# Patient Record
Sex: Female | Born: 1935
Health system: Southern US, Community
[De-identification: ages and names within clinical notes are randomized; demographics above are authoritative.]

## PROBLEM LIST (undated history)

## (undated) DIAGNOSIS — M1990 Unspecified osteoarthritis, unspecified site: Secondary | ICD-10-CM

## (undated) DIAGNOSIS — H5310 Unspecified subjective visual disturbances: Principal | ICD-10-CM

## (undated) DIAGNOSIS — M810 Age-related osteoporosis without current pathological fracture: Secondary | ICD-10-CM

## (undated) DIAGNOSIS — R51 Headache: Secondary | ICD-10-CM

## (undated) DIAGNOSIS — J329 Chronic sinusitis, unspecified: Secondary | ICD-10-CM

## (undated) DIAGNOSIS — L219 Seborrheic dermatitis, unspecified: Secondary | ICD-10-CM

## (undated) DIAGNOSIS — G459 Transient cerebral ischemic attack, unspecified: Secondary | ICD-10-CM

## (undated) DIAGNOSIS — H9319 Tinnitus, unspecified ear: Secondary | ICD-10-CM

## (undated) DIAGNOSIS — E785 Hyperlipidemia, unspecified: Secondary | ICD-10-CM

## (undated) DIAGNOSIS — M5030 Other cervical disc degeneration, unspecified cervical region: Secondary | ICD-10-CM

## (undated) DIAGNOSIS — K5792 Diverticulitis of intestine, part unspecified, without perforation or abscess without bleeding: Secondary | ICD-10-CM

## (undated) DIAGNOSIS — L309 Dermatitis, unspecified: Secondary | ICD-10-CM

## (undated) HISTORY — DX: Other cervical disc degeneration, unspecified cervical region: M50.30

## (undated) HISTORY — PX: NASAL POLYP SURGERY: SHX186

## (undated) HISTORY — DX: Hyperlipidemia, unspecified: E78.5

## (undated) HISTORY — PX: CATARACT EXTRACTION: SUR2

## (undated) HISTORY — DX: Tinnitus, unspecified ear: H93.19

## (undated) HISTORY — PX: TONSILLECTOMY AND ADENOIDECTOMY: SHX28

## (undated) HISTORY — PX: ABDOMINAL HYSTERECTOMY: SHX81

## (undated) HISTORY — DX: Unspecified subjective visual disturbances: H53.10

## (undated) HISTORY — DX: Headache: R51

## (undated) HISTORY — DX: Diverticulitis of intestine, part unspecified, without perforation or abscess without bleeding: K57.92

## (undated) HISTORY — DX: Age-related osteoporosis without current pathological fracture: M81.0

## (undated) HISTORY — DX: Chronic sinusitis, unspecified: J32.9

## (undated) HISTORY — DX: Seborrheic dermatitis, unspecified: L21.9

## (undated) HISTORY — DX: Dermatitis, unspecified: L30.9

## (undated) HISTORY — DX: Unspecified osteoarthritis, unspecified site: M19.90

## (undated) HISTORY — DX: Transient cerebral ischemic attack, unspecified: G45.9

---

## 1979-08-18 HISTORY — PX: APPENDECTOMY: SHX54

## 1996-08-17 HISTORY — PX: CARPAL TUNNEL RELEASE: SHX101

## 2001-06-23 ENCOUNTER — Encounter: Admission: RE | Admit: 2001-06-23 | Discharge: 2001-06-23 | Payer: Self-pay | Admitting: Internal Medicine

## 2001-06-23 ENCOUNTER — Encounter: Payer: Self-pay | Admitting: Internal Medicine

## 2004-10-13 ENCOUNTER — Ambulatory Visit (HOSPITAL_COMMUNITY): Admission: RE | Admit: 2004-10-13 | Discharge: 2004-10-13 | Payer: Self-pay | Admitting: Gastroenterology

## 2012-10-18 ENCOUNTER — Other Ambulatory Visit (HOSPITAL_BASED_OUTPATIENT_CLINIC_OR_DEPARTMENT_OTHER): Payer: Self-pay | Admitting: Family Medicine

## 2012-10-18 DIAGNOSIS — R0789 Other chest pain: Secondary | ICD-10-CM

## 2012-10-21 ENCOUNTER — Other Ambulatory Visit (HOSPITAL_BASED_OUTPATIENT_CLINIC_OR_DEPARTMENT_OTHER): Payer: Self-pay

## 2012-10-21 ENCOUNTER — Ambulatory Visit (HOSPITAL_BASED_OUTPATIENT_CLINIC_OR_DEPARTMENT_OTHER): Payer: Medicare Other

## 2012-10-24 ENCOUNTER — Encounter (HOSPITAL_BASED_OUTPATIENT_CLINIC_OR_DEPARTMENT_OTHER): Payer: Self-pay

## 2012-10-24 ENCOUNTER — Ambulatory Visit (HOSPITAL_BASED_OUTPATIENT_CLINIC_OR_DEPARTMENT_OTHER)
Admission: RE | Admit: 2012-10-24 | Discharge: 2012-10-24 | Disposition: A | Discharge status: Home / Self Care | Payer: Medicare Other | Source: Ambulatory Visit | Attending: Family Medicine | Admitting: Family Medicine

## 2012-10-24 DIAGNOSIS — R079 Chest pain, unspecified: Secondary | ICD-10-CM | POA: Insufficient documentation

## 2012-10-24 DIAGNOSIS — R0789 Other chest pain: Secondary | ICD-10-CM

## 2012-10-24 MED ORDER — IOHEXOL 300 MG/ML  SOLN
80.0000 mL | Freq: Once | INTRAMUSCULAR | Status: AC | PRN
  Administered 2012-10-24: 80 mL via INTRAVENOUS

## 2014-09-03 DIAGNOSIS — K449 Diaphragmatic hernia without obstruction or gangrene: Secondary | ICD-10-CM | POA: Diagnosis not present

## 2014-09-03 DIAGNOSIS — D132 Benign neoplasm of duodenum: Secondary | ICD-10-CM | POA: Diagnosis not present

## 2014-09-03 DIAGNOSIS — K250 Acute gastric ulcer with hemorrhage: Secondary | ICD-10-CM | POA: Diagnosis not present

## 2014-10-03 DIAGNOSIS — H539 Unspecified visual disturbance: Secondary | ICD-10-CM | POA: Diagnosis not present

## 2014-10-25 ENCOUNTER — Ambulatory Visit (INDEPENDENT_AMBULATORY_CARE_PROVIDER_SITE_OTHER): Payer: Commercial Managed Care - HMO | Admitting: Neurology

## 2014-10-25 ENCOUNTER — Encounter: Payer: Self-pay | Admitting: Neurology

## 2014-10-25 VITALS — BP 152/79 | HR 97 | Ht <= 58 in | Wt 99.6 lb

## 2014-10-25 DIAGNOSIS — G441 Vascular headache, not elsewhere classified: Secondary | ICD-10-CM | POA: Diagnosis not present

## 2014-10-25 DIAGNOSIS — R51 Headache: Secondary | ICD-10-CM

## 2014-10-25 DIAGNOSIS — H5310 Unspecified subjective visual disturbances: Secondary | ICD-10-CM | POA: Insufficient documentation

## 2014-10-25 DIAGNOSIS — R519 Headache, unspecified: Secondary | ICD-10-CM

## 2014-10-25 HISTORY — DX: Headache, unspecified: R51.9

## 2014-10-25 HISTORY — DX: Unspecified subjective visual disturbances: H53.10

## 2014-10-25 NOTE — Patient Instructions (Addendum)
   We will check MRI evaluation the brain, MRA of the head to look at the circulation to the head, and we will get a carotid Doppler study to look at the circulation in the neck. Blood work will be done today. Stay on the Plavix therapy.  Visual Disturbances You have had a disturbance in your vision. This may be caused by various conditions, such as:  Migraines. Migraine headaches are often preceded by a disturbance in vision. Blind spots or light flashes are followed by a headache. This type of visual disturbance is temporary. It does not damage the eye.  Glaucoma. This is caused by increased pressure in the eye. Symptoms include haziness, blurred vision, or seeing rainbow colored circles when looking at bright lights. Partial or complete visual loss can occur. You may or may not experience eye pain. Visual loss may be gradual or sudden and is irreversible. Glaucoma is the leading cause of blindness.  Retina problems. Vision will be reduced if the retina becomes detached or if there is a circulation problem as with diabetes, high blood pressure, or a mini-stroke. Symptoms include seeing "floaters," flashes of light, or shadows, as if a curtain has fallen over your eye.  Optic nerve problems. The main nerve in your eye can be damaged by redness, soreness, and swelling (inflammation), poor circulation, drugs, and toxins. It is very important to have a complete exam done by a specialist to determine the exact cause of your eye problem. The specialist may recommend medicines or surgery, depending on the cause of the problem. This can help prevent further loss of vision or reduce the risk of having a stroke. Contact the caregiver to whom you have been referred and arrange for follow-up care right away. SEEK IMMEDIATE MEDICAL CARE IF:   Your vision gets worse.  You develop severe headaches.  You have any weakness or numbness in the face, arms, or legs.  You have any trouble speaking or  walking. Document Released: 09/10/2004 Document Revised: 10/26/2011 Document Reviewed: 01/01/2010 [facility] Patient Information 2015 [place], [place]. This information is not intended to replace advice given to you by your health care provider. Make sure you discuss any questions you have with your health care provider.

## 2014-10-25 NOTE — Progress Notes (Signed)
Reason for visit: Transient visual disturbance  Referring physician:  Dr. Estelle June Jill Pitts is a 81 y.o. female  History of present illness:  Jill Pitts is an 81 year old right-handed white female with a history of transient visual disturbances in the past. She indicates that she first began having episodes in 2002 associated with a lower quadrant visual field deficit involving the right eye only. The patient was seen and evaluated again 7 years later in 2009 for similar events that affected the right visual field as well. The patient would have some headaches following these events of visual disturbance. The patient had a full workup in 2009 with MRI of the brain and MRA of the head and neck that were unremarkable. The patient was told she had a migraine-type events. She has been on aspirin in the past, but she could not take the medication because of an upper GI bleed associated with peptic ulcer disease. She has not been on any antiplatelet agents until just recently. On the 10th of February, 2016, the patient had an event affecting the vision in the left eye only associated with a zigzag line across the middle of the visual field and transient loss of vision below the line, normal vision above the line. The episode lasted about 30 minutes, and was followed by a right temporal headache that lasted several hours. The patient had a second event 3 days later that was similar to the first. The patient has had 2 other events of transient visual loss unassociated with headache. The patient has not been to see her ophthalmologist since the visual field episodes have occurred. The patient returns to normal vision following the events. The patient was seen by her primary care physician, and she has been placed on Plavix. She currently is on this medication. She denies any transient numbness or weakness on the face, arms, or legs. She has in the past had an episode of transient left lower face numbness in  2014 following cataract surgery. She denies any family history of migraine, and no personal history of migraine growing up. She comes here for further evaluation.  Past Medical History  Diagnosis Date  . TIA (transient ischemic attack)   . Osteoporosis   . Hyperlipidemia   . Diverticulitis   . Osteoarthritis   . Tinnitus   . Eczema   . Sinusitis   . Degenerative disc disease, cervical   . Seborrheic dermatitis   . Subjective visual disturbance 10/25/2014  . Headache 10/25/2014    Past Surgical History  Procedure Laterality Date  . Abdominal hysterectomy    . Appendectomy    . Nasal polyp surgery    . Cataract extraction Bilateral     Family History  Problem Relation Age of Onset  . Heart disease Father   . Prostate cancer Father   . Asthma Sister   . Hypertension Sister   . Diabetes Sister   . Stroke Brother     Social history:  reports that she has quit smoking. She has never used smokeless tobacco. She reports that she drinks alcohol. She reports that she does not use illicit drugs.  Medications:  Prior to Admission medications   Medication Sig Start Date End Date Taking? Authorizing Provider  augmented betamethasone dipropionate (DIPROLENE-AF) 0.05 % cream Apply topically daily.   Yes Historical Provider, MD  Biotin 1 MG CAPS Take 10,000 mg by mouth daily.   Yes Historical Provider, MD  Calcium Carb-Cholecalciferol (CALCIUM + D3) 600-200 MG-UNIT  TABS Take 1 tablet by mouth 2 (two) times daily.   Yes Historical Provider, MD  clopidogrel (PLAVIX) 75 MG tablet Take 75 mg by mouth daily.   Yes Historical Provider, MD  Fish Oil OIL Take 1,400 mg by mouth 2 (two) times daily.   Yes Historical Provider, MD  lovastatin (MEVACOR) 20 MG tablet Take 20 mg by mouth daily. 08/28/14  Yes Historical Provider, MD  Multiple Vitamins-Minerals (MULTIVITAMIN ADULT PO) Take 1 tablet by mouth daily.   Yes Historical Provider, MD     No Known Allergies  ROS:  Out of a complete 14  system review of symptoms, the patient complains only of the following symptoms, and all other reviewed systems are negative.  Loss of vision Headache Ringing in the ears  Blood pressure 152/79, pulse 97, height 4\' 10"  (1.473 m), weight 99 lb 9.6 oz (45.178 kg).  Physical Exam  General: The patient is alert and cooperative at the time of the examination.  Eyes: Pupils are equal, round, and reactive to light. Discs are flat bilaterally.  Neck: The neck is supple, no carotid bruits are noted.  Respiratory: The respiratory examination is clear.  Cardiovascular: The cardiovascular examination reveals a regular rate and rhythm, no obvious murmurs or rubs are noted.  Skin: Extremities are without significant edema.  Neurologic Exam  Mental status: The patient is alert and oriented x 3 at the time of the examination. The patient has apparent normal recent and remote memory, with an apparently normal attention span and concentration ability.  Cranial nerves: Facial symmetry is present. There is good sensation of the face to pinprick and soft touch bilaterally. The strength of the facial muscles and the muscles to head turning and shoulder shrug are normal bilaterally. Speech is well enunciated, no aphasia or dysarthria is noted. Extraocular movements are full. Visual fields are full. The tongue is midline, and the patient has symmetric elevation of the soft palate. No obvious hearing deficits are noted.  Motor: The motor testing reveals 5 over 5 strength of all 4 extremities. Good symmetric motor tone is noted throughout.  Sensory: Sensory testing is intact to pinprick, soft touch, vibration sensation, and position sense on all 4 extremities. No evidence of extinction is noted.  Coordination: Cerebellar testing reveals good finger-nose-finger and heel-to-shin bilaterally.  Gait and station: Gait is normal. Tandem gait is normal. Romberg is negative. No drift is seen.  Reflexes: Deep  tendon reflexes are symmetric, but are slightly depressed bilaterally. Toes are downgoing bilaterally.   Assessment/Plan:  1. Transient visual disturbance  2. Headache  The patient reports episodes over the last several years of occasional events of visual disturbances, occasionally followed by headache. The events certainly could represent migraine events. The patient believes that the visual episodes occur in one eye or the other, not both eyes together. The patient will once again undergo a cerebrovascular workup. She will have MRI evaluation of the brain, MRA of the head, and carotid Doppler study. She is to remain on Plavix therapy. She will have blood work done for a sedimentation rate. She will follow-up to this office if needed. I have recommended that she see her ophthalmologist for a full eye examination.  Jill Alexanders MD 10/25/2014 8:22 PM  [facility] [address] [place],  [address]  Phone [phone] Fax [phone]

## 2014-10-26 LAB — SEDIMENTATION RATE: SED RATE: 2 mm/h (ref 0–40)

## 2014-10-31 DIAGNOSIS — H35373 Puckering of macula, bilateral: Secondary | ICD-10-CM | POA: Diagnosis not present

## 2014-11-01 ENCOUNTER — Telehealth: Payer: Self-pay | Admitting: Radiology

## 2014-11-01 NOTE — Telephone Encounter (Signed)
Left message to call for scheduling Carotid doppler.

## 2014-11-14 ENCOUNTER — Ambulatory Visit (INDEPENDENT_AMBULATORY_CARE_PROVIDER_SITE_OTHER): Payer: Commercial Managed Care - HMO

## 2014-11-14 DIAGNOSIS — H5310 Unspecified subjective visual disturbances: Secondary | ICD-10-CM | POA: Diagnosis not present

## 2014-11-14 DIAGNOSIS — G441 Vascular headache, not elsewhere classified: Secondary | ICD-10-CM

## 2014-11-19 ENCOUNTER — Telehealth: Payer: Self-pay | Admitting: Neurology

## 2014-11-19 ENCOUNTER — Ambulatory Visit
Admission: RE | Admit: 2014-11-19 | Discharge: 2014-11-19 | Disposition: A | Discharge status: Home / Self Care | Payer: Commercial Managed Care - HMO | Source: Ambulatory Visit | Attending: Neurology | Admitting: Neurology

## 2014-11-19 DIAGNOSIS — G930 Cerebral cysts: Secondary | ICD-10-CM | POA: Diagnosis not present

## 2014-11-19 DIAGNOSIS — G441 Vascular headache, not elsewhere classified: Secondary | ICD-10-CM

## 2014-11-19 DIAGNOSIS — H5310 Unspecified subjective visual disturbances: Secondary | ICD-10-CM | POA: Diagnosis not present

## 2014-11-19 DIAGNOSIS — I6629 Occlusion and stenosis of unspecified posterior cerebral artery: Secondary | ICD-10-CM | POA: Diagnosis not present

## 2014-11-19 DIAGNOSIS — I6782 Cerebral ischemia: Secondary | ICD-10-CM | POA: Diagnosis not present

## 2014-11-19 NOTE — Telephone Encounter (Signed)
I called the patient. MRI the brain shows a moderate level small vessel disease, no acute changes were seen. The patient has had MRA of the head that by my review appears to be unremarkable, no significant stenosis of the medium or large artery supplying the brain. A carotid Doppler study has been done, results are pending. Patient is on Plavix therapy.   MRI brain 11/19/2014:  IMPRESSION: This is an abnormal MRI of the brain with and without contrast showing the following: 1. Extensive single and confluent hyperintense foci in both hemispheres consistent with advanced small vessel ischemic changes. None of these changes appears to be acute. 2. An extra-axial arachnoid cyst on the left between the occipital and parietal lobes. 3. Fluid within the mastoid air cells on the right most consistent with mild eustachian tube dysfunction. Mild chronic inflammatory changes in the left maxillary sinus and one of the ethmoid air cells.

## 2014-11-20 ENCOUNTER — Telehealth: Payer: Self-pay | Admitting: Neurology

## 2014-11-20 NOTE — Telephone Encounter (Signed)
I called patient. The carotid Doppler study appears to be unremarkable. I discussed this with the patient. She is to stay on Plavix, continue to monitor her blood pressure.

## 2014-12-10 DIAGNOSIS — Z8262 Family history of osteoporosis: Secondary | ICD-10-CM | POA: Diagnosis not present

## 2014-12-18 DIAGNOSIS — M1811 Unilateral primary osteoarthritis of first carpometacarpal joint, right hand: Secondary | ICD-10-CM | POA: Diagnosis not present

## 2015-03-20 DIAGNOSIS — Z1329 Encounter for screening for other suspected endocrine disorder: Secondary | ICD-10-CM | POA: Diagnosis not present

## 2015-03-20 DIAGNOSIS — E780 Pure hypercholesterolemia: Secondary | ICD-10-CM | POA: Diagnosis not present

## 2015-03-20 DIAGNOSIS — K130 Diseases of lips: Secondary | ICD-10-CM | POA: Diagnosis not present

## 2015-03-20 DIAGNOSIS — Z1211 Encounter for screening for malignant neoplasm of colon: Secondary | ICD-10-CM | POA: Diagnosis not present

## 2015-03-20 DIAGNOSIS — Z0000 Encounter for general adult medical examination without abnormal findings: Secondary | ICD-10-CM | POA: Diagnosis not present

## 2015-03-20 DIAGNOSIS — Z131 Encounter for screening for diabetes mellitus: Secondary | ICD-10-CM | POA: Diagnosis not present

## 2015-03-20 DIAGNOSIS — M810 Age-related osteoporosis without current pathological fracture: Secondary | ICD-10-CM | POA: Diagnosis not present

## 2015-03-20 DIAGNOSIS — Z23 Encounter for immunization: Secondary | ICD-10-CM | POA: Diagnosis not present

## 2015-05-08 DIAGNOSIS — H5203 Hypermetropia, bilateral: Secondary | ICD-10-CM | POA: Diagnosis not present

## 2015-05-08 DIAGNOSIS — H35373 Puckering of macula, bilateral: Secondary | ICD-10-CM | POA: Diagnosis not present

## 2015-05-08 DIAGNOSIS — Z961 Presence of intraocular lens: Secondary | ICD-10-CM | POA: Diagnosis not present

## 2015-09-13 DIAGNOSIS — R071 Chest pain on breathing: Secondary | ICD-10-CM | POA: Diagnosis not present

## 2015-09-13 DIAGNOSIS — R0781 Pleurodynia: Secondary | ICD-10-CM | POA: Diagnosis not present

## 2015-10-01 DIAGNOSIS — R918 Other nonspecific abnormal finding of lung field: Secondary | ICD-10-CM | POA: Diagnosis not present

## 2015-10-01 DIAGNOSIS — Z79899 Other long term (current) drug therapy: Secondary | ICD-10-CM | POA: Diagnosis not present

## 2015-10-01 DIAGNOSIS — Z8673 Personal history of transient ischemic attack (TIA), and cerebral infarction without residual deficits: Secondary | ICD-10-CM | POA: Diagnosis not present

## 2015-10-01 DIAGNOSIS — R0789 Other chest pain: Secondary | ICD-10-CM | POA: Diagnosis not present

## 2015-10-01 DIAGNOSIS — Z87891 Personal history of nicotine dependence: Secondary | ICD-10-CM | POA: Diagnosis not present

## 2015-10-01 DIAGNOSIS — Z7982 Long term (current) use of aspirin: Secondary | ICD-10-CM | POA: Diagnosis not present

## 2015-10-01 DIAGNOSIS — R091 Pleurisy: Secondary | ICD-10-CM | POA: Diagnosis not present

## 2015-10-01 DIAGNOSIS — I10 Essential (primary) hypertension: Secondary | ICD-10-CM | POA: Diagnosis not present

## 2015-10-01 DIAGNOSIS — J168 Pneumonia due to other specified infectious organisms: Secondary | ICD-10-CM | POA: Diagnosis not present

## 2015-10-01 DIAGNOSIS — M810 Age-related osteoporosis without current pathological fracture: Secondary | ICD-10-CM | POA: Diagnosis not present

## 2015-10-01 DIAGNOSIS — M1990 Unspecified osteoarthritis, unspecified site: Secondary | ICD-10-CM | POA: Diagnosis not present

## 2015-10-09 DIAGNOSIS — J189 Pneumonia, unspecified organism: Secondary | ICD-10-CM | POA: Diagnosis not present

## 2015-10-14 ENCOUNTER — Other Ambulatory Visit (HOSPITAL_BASED_OUTPATIENT_CLINIC_OR_DEPARTMENT_OTHER): Payer: Self-pay | Admitting: Family Medicine

## 2015-10-14 DIAGNOSIS — J189 Pneumonia, unspecified organism: Secondary | ICD-10-CM

## 2015-11-04 ENCOUNTER — Ambulatory Visit (HOSPITAL_BASED_OUTPATIENT_CLINIC_OR_DEPARTMENT_OTHER)
Admission: RE | Admit: 2015-11-04 | Discharge: 2015-11-04 | Disposition: A | Discharge status: Home / Self Care | Payer: Commercial Managed Care - HMO | Source: Ambulatory Visit | Attending: Family Medicine | Admitting: Family Medicine

## 2015-11-04 DIAGNOSIS — J189 Pneumonia, unspecified organism: Secondary | ICD-10-CM | POA: Diagnosis not present

## 2015-11-04 MED ORDER — IOHEXOL 300 MG/ML  SOLN
80.0000 mL | Freq: Once | INTRAMUSCULAR | Status: AC | PRN
  Administered 2015-11-04: 80 mL via INTRAVENOUS

## 2015-11-06 DIAGNOSIS — H35373 Puckering of macula, bilateral: Secondary | ICD-10-CM | POA: Diagnosis not present

## 2016-02-04 DIAGNOSIS — M549 Dorsalgia, unspecified: Secondary | ICD-10-CM | POA: Diagnosis not present

## 2016-03-16 DIAGNOSIS — M549 Dorsalgia, unspecified: Secondary | ICD-10-CM | POA: Diagnosis not present

## 2016-03-23 DIAGNOSIS — M549 Dorsalgia, unspecified: Secondary | ICD-10-CM | POA: Diagnosis not present

## 2016-03-30 DIAGNOSIS — R0600 Dyspnea, unspecified: Secondary | ICD-10-CM | POA: Diagnosis not present

## 2016-03-30 DIAGNOSIS — Z79899 Other long term (current) drug therapy: Secondary | ICD-10-CM | POA: Diagnosis not present

## 2016-03-30 DIAGNOSIS — Z8673 Personal history of transient ischemic attack (TIA), and cerebral infarction without residual deficits: Secondary | ICD-10-CM | POA: Diagnosis not present

## 2016-03-30 DIAGNOSIS — R0789 Other chest pain: Secondary | ICD-10-CM | POA: Diagnosis not present

## 2016-03-30 DIAGNOSIS — M546 Pain in thoracic spine: Secondary | ICD-10-CM | POA: Diagnosis not present

## 2016-03-30 DIAGNOSIS — M1990 Unspecified osteoarthritis, unspecified site: Secondary | ICD-10-CM | POA: Diagnosis not present

## 2016-03-30 DIAGNOSIS — R079 Chest pain, unspecified: Secondary | ICD-10-CM | POA: Diagnosis not present

## 2016-03-30 DIAGNOSIS — I10 Essential (primary) hypertension: Secondary | ICD-10-CM | POA: Diagnosis not present

## 2016-03-30 DIAGNOSIS — M810 Age-related osteoporosis without current pathological fracture: Secondary | ICD-10-CM | POA: Diagnosis not present

## 2016-03-30 DIAGNOSIS — Z7982 Long term (current) use of aspirin: Secondary | ICD-10-CM | POA: Diagnosis not present

## 2016-03-30 DIAGNOSIS — R110 Nausea: Secondary | ICD-10-CM | POA: Diagnosis not present

## 2016-04-10 ENCOUNTER — Encounter: Payer: Self-pay | Admitting: Neurology

## 2016-04-10 ENCOUNTER — Ambulatory Visit (INDEPENDENT_AMBULATORY_CARE_PROVIDER_SITE_OTHER): Payer: Commercial Managed Care - HMO | Admitting: Neurology

## 2016-04-10 DIAGNOSIS — M47812 Spondylosis without myelopathy or radiculopathy, cervical region: Secondary | ICD-10-CM | POA: Diagnosis not present

## 2016-04-10 MED ORDER — GABAPENTIN 100 MG PO CAPS: 100.0000 mg | ORAL_CAPSULE | Freq: Two times a day (BID) | ORAL | 2 refills | Status: DC

## 2016-04-10 NOTE — Progress Notes (Signed)
Reason for visit: Neck and arm discomfort  Referring physician: Dr. Haynes Hoehn RYN OGRADY is a 82 y.o. female  History of present illness:  Ms. Jill Pitts is an 82 year old right-handed white female with a history of onset of right interscapular pain that began in January 2017. The patient had very severe pain by February 14, and she went to the emergency room for an evaluation. She underwent an x-ray of the chest and was told she had pneumonia and that she may have pleurisy. She was treated for this with some benefit, but the pain has come and gone to some degree since that time. The patient has had increased pain around August 14, she went to the emergency room again. The patient has not had any chest pathology that explains her discomfort. The patient more recently has had some numbness into the right arm from the elbow to the wrist, she has a sensation of some slight weakness of the hand on that side. If she turns her head to the right, she will have some increased pain into the interscapular area on the right. The patient denies any discomfort on the left side or any problems with the legs. She has not had any change in her balance, no falls. She denies headaches. She will have good days and bad days with the pain. She sleeps well at night. She has Ultram to take if needed. She is sent to this office for further evaluation.  Past Medical History:  Diagnosis Date  . Degenerative disc disease, cervical   . Diverticulitis   . Eczema   . Headache 10/25/2014  . Hyperlipidemia   . Osteoarthritis   . Osteoporosis   . Seborrheic dermatitis   . Sinusitis   . Subjective visual disturbance 10/25/2014  . TIA (transient ischemic attack)   . Tinnitus     Past Surgical History:  Procedure Laterality Date  . ABDOMINAL HYSTERECTOMY    . APPENDECTOMY  1981  . CARPAL TUNNEL RELEASE Right 1998  . CATARACT EXTRACTION Bilateral   . NASAL POLYP SURGERY    . TONSILLECTOMY AND ADENOIDECTOMY      Family  History  Problem Relation Age of Onset  . Heart disease Father   . Prostate cancer Father   . Asthma Sister   . Hypertension Sister   . Diabetes Sister   . Stroke Brother     Social history:  reports that she has quit smoking. She has never used smokeless tobacco. She reports that she drinks alcohol. She reports that she does not use drugs.  Medications:  Prior to Admission medications   Medication Sig Start Date End Date Taking? Authorizing Provider  Biotin 1 MG CAPS Take 10,000 mg by mouth daily.   Yes Historical Provider, MD  Calcium Carb-Cholecalciferol (CALCIUM + D3) 600-200 MG-UNIT TABS Take 1 tablet by mouth 2 (two) times daily.   Yes Historical Provider, MD  clopidogrel (PLAVIX) 75 MG tablet Take 75 mg by mouth daily.   Yes Historical Provider, MD  Fish Oil OIL Take 1,400 mg by mouth 2 (two) times daily.   Yes Historical Provider, MD  lovastatin (MEVACOR) 20 MG tablet Take 20 mg by mouth daily. 08/28/14  Yes Historical Provider, MD  Multiple Vitamins-Minerals (MULTIVITAMIN ADULT PO) Take 1 tablet by mouth daily.   Yes Historical Provider, MD  traMADol Veatrice Bourbon) 50 MG tablet  03/16/16  Yes Historical Provider, MD     No Known Allergies  ROS:  Out of a complete 14  system review of symptoms, the patient complains only of the following symptoms, and all other reviewed systems are negative.  Numbness Achy muscles  Blood pressure (!) 190/85, pulse 88, height 4\' 10"  (1.473 m), weight 93 lb (42.2 kg).   Repeat blood pressure in right arm 148/88.  Physical Exam  General: The patient is alert and cooperative at the time of the examination.  Eyes: Pupils are equal, round, and reactive to light. Discs are flat bilaterally.  Neck: The neck is supple, no carotid bruits are noted.  Respiratory: The respiratory examination is clear.  Cardiovascular: The cardiovascular examination reveals a regular rate and rhythm, no obvious murmurs or rubs are noted.  Neuromuscular: The patient  lacks about 30 of full lateral rotation of the cervical spine bilaterally.  Skin: Extremities are without significant edema.  Neurologic Exam  Mental status: The patient is alert and oriented x 3 at the time of the examination. The patient has apparent normal recent and remote memory, with an apparently normal attention span and concentration ability.  Cranial nerves: Facial symmetry is present. There is good sensation of the face to pinprick and soft touch bilaterally. The strength of the facial muscles and the muscles to head turning and shoulder shrug are normal bilaterally. Speech is well enunciated, no aphasia or dysarthria is noted. Extraocular movements are full. Visual fields are full. The tongue is midline, and the patient has symmetric elevation of the soft palate. No obvious hearing deficits are noted.  Motor: The motor testing reveals 5 over 5 strength of all 4 extremities. Good symmetric motor tone is noted throughout.  Sensory: Sensory testing is intact to pinprick, soft touch, vibration sensation, and position sense on all 4 extremities, with exception of some decreased pinprick sensation on the right forearm is compared to the left. No evidence of extinction is noted.  Coordination: Cerebellar testing reveals good finger-nose-finger and heel-to-shin bilaterally.  Gait and station: Gait is normal. Tandem gait is normal for age. Romberg is negative. No drift is seen.  Reflexes: Deep tendon reflexes are symmetric and normal bilaterally. Toes are downgoing bilaterally.   Assessment/Plan:  1. Cervical spondylosis  The patient is reporting some discomfort into the interscapular area on the right and some numbness down the right arm. The patient may have a C6 distribution low-grade radiculopathy. The patient is not able to take nonsteroidal anti-inflammatory medications secondary to a prior history of peptic ulcer disease. She will be placed on low-dose gabapentin. If the pain  worsens, we will check MRI of the cervical spine and consider an epidural steroid injection. She will follow-up otherwise in 4 or 5 months.  Jill Alexanders MD 04/10/2016 11:57 AM  [facility] [address] [place], [place] [address]  Phone [phone] Fax [phone]

## 2016-04-10 NOTE — Patient Instructions (Addendum)
    Neurontin (gabapentin) may result in drowsiness, ankle swelling, gait instability, or possibly dizziness. Please contact our office if significant side effects occur with this medication.   Radicular Pain Radicular pain in either the arm or leg is usually from a bulging or herniated disk in the spine. A piece of the herniated disk may press against the nerves as the nerves exit the spine. This causes pain which is felt at the tips of the nerves down the arm or leg. Other causes of radicular pain may include:  Fractures.  Heart disease.  Cancer.  An abnormal and usually degenerative state of the nervous system or nerves (neuropathy). Diagnosis may require CT or MRI scanning to determine the primary cause.  Nerves that start at the neck (nerve roots) may cause radicular pain in the outer shoulder and arm. It can spread down to the thumb and fingers. The symptoms vary depending on which nerve root has been affected. In most cases radicular pain improves with conservative treatment. Neck problems may require physical therapy, a neck collar, or cervical traction. Treatment may take many weeks, and surgery may be considered if the symptoms do not improve.  Conservative treatment is also recommended for sciatica. Sciatica causes pain to radiate from the lower back or buttock area down the leg into the foot. Often there is a history of back problems. Most patients with sciatica are better after 2 to 4 weeks of rest and other supportive care. Short term bed rest can reduce the disk pressure considerably. Sitting, however, is not a good position since this increases the pressure on the disk. You should avoid bending, lifting, and all other activities which make the problem worse. Traction can be used in severe cases. Surgery is usually reserved for patients who do not improve within the first months of treatment. Only take over-the-counter or prescription medicines for pain, discomfort, or fever as  directed by your caregiver. Narcotics and muscle relaxants may help by relieving more severe pain and spasm and by providing mild sedation. Cold or massage can give significant relief. Spinal manipulation is not recommended. It can increase the degree of disc protrusion. Epidural steroid injections are often effective treatment for radicular pain. These injections deliver medicine to the spinal nerve in the space between the protective covering of the spinal cord and back bones (vertebrae). Your caregiver can give you more information about steroid injections. These injections are most effective when given within two weeks of the onset of pain.  You should see your caregiver for follow up care as recommended. A program for neck and back injury rehabilitation with stretching and strengthening exercises is an important part of management.  SEEK IMMEDIATE MEDICAL CARE IF:  You develop increased pain, weakness, or numbness in your arm or leg.  You develop difficulty with bladder or bowel control.  You develop abdominal pain.   This information is not intended to replace advice given to you by your health care provider. Make sure you discuss any questions you have with your health care provider.   Document Released: 09/10/2004 Document Revised: 08/24/2014 Document Reviewed: 02/27/2015 Elsevier Interactive Patient Education [employer].

## 2016-04-15 ENCOUNTER — Telehealth: Payer: Self-pay | Admitting: Neurology

## 2016-04-15 DIAGNOSIS — R630 Anorexia: Secondary | ICD-10-CM | POA: Diagnosis not present

## 2016-04-15 DIAGNOSIS — R002 Palpitations: Secondary | ICD-10-CM | POA: Diagnosis not present

## 2016-04-15 DIAGNOSIS — F439 Reaction to severe stress, unspecified: Secondary | ICD-10-CM | POA: Diagnosis not present

## 2016-04-15 NOTE — Telephone Encounter (Signed)
Returned call to patient - she has been taking gabapentin 100mg , BID x 4 days.  For the last day, she has been feeling palpitations and has a rapid heart rate.  She is concerned this is due to her medication.  She told me her heart last night before bed was 108 then 112 this morning.  She checked it again while I was on the phone with her and it was 106 with a BP of 149/101.

## 2016-04-15 NOTE — Telephone Encounter (Signed)
Pt called sts she ahs been taking gabapentin (NEURONTIN) 100 MG capsule for the past 4 days. She is experiencing rapid heart rate starting 2 days ago.

## 2016-04-15 NOTE — Telephone Encounter (Signed)
I called the patient. The patient indicates that after starting gabapentin she has noted increased heart rate to around 110. I have never heard of this side effect from gabapentin, it is not listed. I would recommend that she go to her primary care doctor and get EKG evaluation. Okay to stop the gabapentin for now.

## 2016-04-21 NOTE — Telephone Encounter (Signed)
I called patient. The patient apparently has had increased heart rate on gabapentin. She will stay off of the medication, if she is not having any pain, we will not start any other medications.

## 2016-04-21 NOTE — Telephone Encounter (Signed)
Returned pt TC. She reports that her HR did go up to 125 bpm during EKG but otherwise was "completely normal." This week, her HR has been running in the 70s-80s. Despite stopping gabapentin, she has not had any pain recently which she feels is related to how she has been positioning herself at night while sleeping. Since she still has remaining capsules, she would like to talk w/ Dr. Jannifer Franklin about taking gabapentin only as needed if/when pain returns before "tossing them." She plans on keeping her 4 mo follow-up appt scheduled in January.

## 2016-04-21 NOTE — Addendum Note (Signed)
Addended by: Margette Fast on: 04/21/2016 05:33 PM   Modules accepted: Orders

## 2016-04-21 NOTE — Telephone Encounter (Signed)
Patient is calling stating she had an EKG done and was told it was normal. She stopped taking gabapentin (NEURONTIN) 100 MG capsule and is feeling better. She is now pain free. She wants to know what she should do next? Please call and discuss.

## 2016-04-28 DIAGNOSIS — Z0000 Encounter for general adult medical examination without abnormal findings: Secondary | ICD-10-CM | POA: Diagnosis not present

## 2016-04-28 DIAGNOSIS — M810 Age-related osteoporosis without current pathological fracture: Secondary | ICD-10-CM | POA: Diagnosis not present

## 2016-04-28 DIAGNOSIS — Z8673 Personal history of transient ischemic attack (TIA), and cerebral infarction without residual deficits: Secondary | ICD-10-CM | POA: Diagnosis not present

## 2016-04-28 DIAGNOSIS — E7800 Pure hypercholesterolemia, unspecified: Secondary | ICD-10-CM | POA: Diagnosis not present

## 2016-04-28 DIAGNOSIS — I700 Atherosclerosis of aorta: Secondary | ICD-10-CM | POA: Diagnosis not present

## 2016-04-28 DIAGNOSIS — Z1211 Encounter for screening for malignant neoplasm of colon: Secondary | ICD-10-CM | POA: Diagnosis not present

## 2016-05-05 DIAGNOSIS — Z1211 Encounter for screening for malignant neoplasm of colon: Secondary | ICD-10-CM | POA: Diagnosis not present

## 2016-06-08 ENCOUNTER — Encounter: Payer: Self-pay | Admitting: Neurology

## 2016-06-12 DIAGNOSIS — H35373 Puckering of macula, bilateral: Secondary | ICD-10-CM | POA: Diagnosis not present

## 2016-06-12 DIAGNOSIS — H52203 Unspecified astigmatism, bilateral: Secondary | ICD-10-CM | POA: Diagnosis not present

## 2016-06-12 DIAGNOSIS — Z961 Presence of intraocular lens: Secondary | ICD-10-CM | POA: Diagnosis not present

## 2016-06-12 DIAGNOSIS — H5203 Hypermetropia, bilateral: Secondary | ICD-10-CM | POA: Diagnosis not present

## 2016-06-12 DIAGNOSIS — H524 Presbyopia: Secondary | ICD-10-CM | POA: Diagnosis not present

## 2016-06-12 DIAGNOSIS — H16223 Keratoconjunctivitis sicca, not specified as Sjogren's, bilateral: Secondary | ICD-10-CM | POA: Diagnosis not present

## 2016-07-08 DIAGNOSIS — Z23 Encounter for immunization: Secondary | ICD-10-CM | POA: Diagnosis not present

## 2016-07-08 DIAGNOSIS — T169XXA Foreign body in ear, unspecified ear, initial encounter: Secondary | ICD-10-CM | POA: Diagnosis not present

## 2016-09-14 ENCOUNTER — Encounter: Payer: Self-pay | Admitting: Neurology

## 2016-09-14 ENCOUNTER — Ambulatory Visit (INDEPENDENT_AMBULATORY_CARE_PROVIDER_SITE_OTHER): Payer: Medicare HMO | Admitting: Neurology

## 2016-09-14 VITALS — BP 166/81 | HR 87 | Ht <= 58 in | Wt 103.0 lb

## 2016-09-14 DIAGNOSIS — M47812 Spondylosis without myelopathy or radiculopathy, cervical region: Secondary | ICD-10-CM

## 2016-09-14 NOTE — Progress Notes (Signed)
Reason for visit: Right arm numbness  Jill Pitts is an 83 y.o. female  History of present illness:  Jill Pitts is a 83 year old right-handed white female with a history of right interscapular severe pain, and right arm numbness. The patient has had problems with this for almost a year. The patient was placed on low-dose gabapentin, but this resulted in increased heart rate and she could not tolerate the medication. The patient however, has done well with the interscapular pain. The discomfort has essentially gone away, but her right arm numbness has persisted from the elbow to the hand. The patient has no weakness of the extremities. She otherwise feels well. She returns to this office for an evaluation.  Past Medical History:  Diagnosis Date  . Degenerative disc disease, cervical   . Diverticulitis   . Eczema   . Headache 10/25/2014  . Hyperlipidemia   . Osteoarthritis   . Osteoporosis   . Seborrheic dermatitis   . Sinusitis   . Subjective visual disturbance 10/25/2014  . TIA (transient ischemic attack)   . Tinnitus     Past Surgical History:  Procedure Laterality Date  . ABDOMINAL HYSTERECTOMY    . APPENDECTOMY  1981  . CARPAL TUNNEL RELEASE Right 1998  . CATARACT EXTRACTION Bilateral   . NASAL POLYP SURGERY    . TONSILLECTOMY AND ADENOIDECTOMY      Family History  Problem Relation Age of Onset  . Heart disease Father   . Prostate cancer Father   . Asthma Sister   . Hypertension Sister   . Diabetes Sister   . Stroke Brother     Social history:  reports that she has quit smoking. She has never used smokeless tobacco. She reports that she drinks alcohol. She reports that she does not use drugs.    Allergies  Allergen Reactions  . Gabapentin     Tachycardia    Medications:  Prior to Admission medications   Medication Sig Start Date End Date Taking? Authorizing Provider  Biotin 1 MG CAPS Take 10,000 mg by mouth daily.   Yes Historical Provider, MD  Calcium  Carb-Cholecalciferol (CALCIUM + D3) 600-200 MG-UNIT TABS Take 1 tablet by mouth 2 (two) times daily.   Yes Historical Provider, MD  clopidogrel (PLAVIX) 75 MG tablet Take 75 mg by mouth daily.   Yes Historical Provider, MD  Co-Enzyme Q-10 30 MG CAPS Take 30 mg by mouth.   Yes Historical Provider, MD  Fish Oil OIL Take 1,400 mg by mouth 2 (two) times daily.   Yes Historical Provider, MD  lovastatin (MEVACOR) 20 MG tablet Take 20 mg by mouth daily. 08/28/14  Yes Historical Provider, MD  Multiple Vitamins-Minerals (MULTIVITAMIN ADULT PO) Take 1 tablet by mouth daily.   Yes Historical Provider, MD  traMADol Veatrice Bourbon) 50 MG tablet  03/16/16   Historical Provider, MD    ROS:  Out of a complete 14 system review of symptoms, the patient complains only of the following symptoms, and all other reviewed systems are negative.  Blurred vision Back pain Urinary urgency Right arm numbness  Blood pressure (!) 166/81, pulse 87, height 4\' 10"  (1.473 m), weight 103 lb (46.7 kg).  Physical Exam  General: The patient is alert and cooperative at the time of the examination.  Neuromuscular: The patient is able to turn the head to the left about 20, and able to turn to the right about 30.  Skin: No significant peripheral edema is noted.   Neurologic Exam  Mental status: The patient is alert and oriented x 3 at the time of the examination. The patient has apparent normal recent and remote memory, with an apparently normal attention span and concentration ability.   Cranial nerves: Facial symmetry is present. Speech is normal, no aphasia or dysarthria is noted. Extraocular movements are full. Visual fields are full.  Motor: The patient has good strength in all 4 extremities.  Sensory examination: Soft touch sensation is symmetric on the face, arms, and legs.  Coordination: The patient has good finger-nose-finger and heel-to-shin bilaterally.  Gait and station: The patient has a normal gait. Tandem  gait is slightly unsteady. Romberg is negative. No drift is seen.  Reflexes: Deep tendon reflexes are symmetric.   Assessment/Plan:  1. Right shoulder pain, right arm numbness  The patient likely has had nerve root impingement, but the pain has disappeared. The patient is not weak, but she has some residual numbness of the right arm. The patient is feeling much better at this time, we will follow her conservatively, she will come back to this office if needed.  Jill Alexanders MD 09/14/2016 4:20 PM  [facility] [address] [place], [place] [address]  Phone [phone] Fax [phone]

## 2016-09-29 DIAGNOSIS — G8929 Other chronic pain: Secondary | ICD-10-CM | POA: Diagnosis not present

## 2016-09-29 DIAGNOSIS — J069 Acute upper respiratory infection, unspecified: Secondary | ICD-10-CM | POA: Diagnosis not present

## 2016-09-29 DIAGNOSIS — R05 Cough: Secondary | ICD-10-CM | POA: Diagnosis not present

## 2016-11-05 DIAGNOSIS — R938 Abnormal findings on diagnostic imaging of other specified body structures: Secondary | ICD-10-CM | POA: Diagnosis not present

## 2016-11-05 DIAGNOSIS — R21 Rash and other nonspecific skin eruption: Secondary | ICD-10-CM | POA: Diagnosis not present

## 2016-12-02 DIAGNOSIS — M810 Age-related osteoporosis without current pathological fracture: Secondary | ICD-10-CM | POA: Diagnosis not present

## 2016-12-02 DIAGNOSIS — Z1231 Encounter for screening mammogram for malignant neoplasm of breast: Secondary | ICD-10-CM | POA: Diagnosis not present

## 2016-12-04 ENCOUNTER — Other Ambulatory Visit: Payer: Self-pay | Admitting: Pharmacist

## 2016-12-04 NOTE — Patient Outreach (Signed)
Jill Pitts is referred to pharmacist by pharmacy technician Etter Sjogren to address questions that she has about the best ways to take her medications. Called and spoke with patient. HIPAA identifiers verified and verbal consent received.  [name] asks about the timing of taking her medications and about which medications she needs to take with or without food. [name] reports that she typically takes her lovastatin with supper at home, but that about 1-2 times/month she misses a dose of this medication when she eats supper out of the house and then is not sure what to do about taking it when she gets home. Counsel [name] that it is best to take lovastatin in the evening, as she currently does, and that it is best absorbed with food. However, counsel patient that she can take this with her evening snack if she has missed taking it with supper. Counsel patient that it is best to take her calcium carbonate with meals, such as with breakfast and supper.   Counsel [name] on the benefit of using a pillbox to organize her medications. Also talk with the patient about storing her medications away from heat and humidity. Patient expresses appreciation for the answers to her questions.  Patient denies any further medication questions/concerns at this time. Provide [name] with my phone number.  Will close pharmacy episode at this time.  Harlow Asa, PharmD, [employer] [phone]

## 2016-12-15 DIAGNOSIS — Z961 Presence of intraocular lens: Secondary | ICD-10-CM | POA: Diagnosis not present

## 2016-12-15 DIAGNOSIS — H5203 Hypermetropia, bilateral: Secondary | ICD-10-CM | POA: Diagnosis not present

## 2016-12-15 DIAGNOSIS — H52203 Unspecified astigmatism, bilateral: Secondary | ICD-10-CM | POA: Diagnosis not present

## 2016-12-15 DIAGNOSIS — H31002 Unspecified chorioretinal scars, left eye: Secondary | ICD-10-CM | POA: Diagnosis not present

## 2016-12-15 DIAGNOSIS — H524 Presbyopia: Secondary | ICD-10-CM | POA: Diagnosis not present

## 2016-12-15 DIAGNOSIS — H04123 Dry eye syndrome of bilateral lacrimal glands: Secondary | ICD-10-CM | POA: Diagnosis not present

## 2016-12-15 DIAGNOSIS — H35373 Puckering of macula, bilateral: Secondary | ICD-10-CM | POA: Diagnosis not present

## 2017-05-12 DIAGNOSIS — K279 Peptic ulcer, site unspecified, unspecified as acute or chronic, without hemorrhage or perforation: Secondary | ICD-10-CM | POA: Diagnosis not present

## 2017-05-12 DIAGNOSIS — Z8673 Personal history of transient ischemic attack (TIA), and cerebral infarction without residual deficits: Secondary | ICD-10-CM | POA: Diagnosis not present

## 2017-05-12 DIAGNOSIS — M1990 Unspecified osteoarthritis, unspecified site: Secondary | ICD-10-CM | POA: Diagnosis not present

## 2017-05-12 DIAGNOSIS — M545 Low back pain: Secondary | ICD-10-CM | POA: Diagnosis not present

## 2017-05-12 DIAGNOSIS — E7800 Pure hypercholesterolemia, unspecified: Secondary | ICD-10-CM | POA: Diagnosis not present

## 2017-05-12 DIAGNOSIS — M810 Age-related osteoporosis without current pathological fracture: Secondary | ICD-10-CM | POA: Diagnosis not present

## 2017-05-12 DIAGNOSIS — Z131 Encounter for screening for diabetes mellitus: Secondary | ICD-10-CM | POA: Diagnosis not present

## 2017-05-12 DIAGNOSIS — I700 Atherosclerosis of aorta: Secondary | ICD-10-CM | POA: Diagnosis not present

## 2017-05-12 DIAGNOSIS — Z0000 Encounter for general adult medical examination without abnormal findings: Secondary | ICD-10-CM | POA: Diagnosis not present

## 2017-06-23 DIAGNOSIS — H35373 Puckering of macula, bilateral: Secondary | ICD-10-CM | POA: Diagnosis not present

## 2017-06-23 DIAGNOSIS — H04123 Dry eye syndrome of bilateral lacrimal glands: Secondary | ICD-10-CM | POA: Diagnosis not present

## 2017-12-16 DIAGNOSIS — H04123 Dry eye syndrome of bilateral lacrimal glands: Secondary | ICD-10-CM | POA: Diagnosis not present

## 2017-12-16 DIAGNOSIS — H52203 Unspecified astigmatism, bilateral: Secondary | ICD-10-CM | POA: Diagnosis not present

## 2017-12-16 DIAGNOSIS — H26492 Other secondary cataract, left eye: Secondary | ICD-10-CM | POA: Diagnosis not present

## 2017-12-16 DIAGNOSIS — H31002 Unspecified chorioretinal scars, left eye: Secondary | ICD-10-CM | POA: Diagnosis not present

## 2017-12-16 DIAGNOSIS — H524 Presbyopia: Secondary | ICD-10-CM | POA: Diagnosis not present

## 2017-12-16 DIAGNOSIS — H35373 Puckering of macula, bilateral: Secondary | ICD-10-CM | POA: Diagnosis not present

## 2017-12-29 DIAGNOSIS — Z1231 Encounter for screening mammogram for malignant neoplasm of breast: Secondary | ICD-10-CM | POA: Diagnosis not present

## 2018-01-04 DIAGNOSIS — R922 Inconclusive mammogram: Secondary | ICD-10-CM | POA: Diagnosis not present

## 2018-05-25 DIAGNOSIS — M810 Age-related osteoporosis without current pathological fracture: Secondary | ICD-10-CM | POA: Diagnosis not present

## 2018-05-25 DIAGNOSIS — Z131 Encounter for screening for diabetes mellitus: Secondary | ICD-10-CM | POA: Diagnosis not present

## 2018-05-25 DIAGNOSIS — I700 Atherosclerosis of aorta: Secondary | ICD-10-CM | POA: Diagnosis not present

## 2018-05-25 DIAGNOSIS — Z8673 Personal history of transient ischemic attack (TIA), and cerebral infarction without residual deficits: Secondary | ICD-10-CM | POA: Diagnosis not present

## 2018-05-25 DIAGNOSIS — Z79899 Other long term (current) drug therapy: Secondary | ICD-10-CM | POA: Diagnosis not present

## 2018-05-25 DIAGNOSIS — E7800 Pure hypercholesterolemia, unspecified: Secondary | ICD-10-CM | POA: Diagnosis not present

## 2018-05-25 DIAGNOSIS — M1990 Unspecified osteoarthritis, unspecified site: Secondary | ICD-10-CM | POA: Diagnosis not present

## 2018-05-25 DIAGNOSIS — M545 Low back pain: Secondary | ICD-10-CM | POA: Diagnosis not present

## 2018-05-25 DIAGNOSIS — Z0000 Encounter for general adult medical examination without abnormal findings: Secondary | ICD-10-CM | POA: Diagnosis not present

## 2018-06-29 DIAGNOSIS — H0100B Unspecified blepharitis left eye, upper and lower eyelids: Secondary | ICD-10-CM | POA: Diagnosis not present

## 2018-06-29 DIAGNOSIS — H0288B Meibomian gland dysfunction left eye, upper and lower eyelids: Secondary | ICD-10-CM | POA: Diagnosis not present

## 2018-06-29 DIAGNOSIS — H02834 Dermatochalasis of left upper eyelid: Secondary | ICD-10-CM | POA: Diagnosis not present

## 2018-06-29 DIAGNOSIS — H26492 Other secondary cataract, left eye: Secondary | ICD-10-CM | POA: Diagnosis not present

## 2018-06-29 DIAGNOSIS — H02831 Dermatochalasis of right upper eyelid: Secondary | ICD-10-CM | POA: Diagnosis not present

## 2018-06-29 DIAGNOSIS — H0288A Meibomian gland dysfunction right eye, upper and lower eyelids: Secondary | ICD-10-CM | POA: Diagnosis not present

## 2018-06-29 DIAGNOSIS — H35373 Puckering of macula, bilateral: Secondary | ICD-10-CM | POA: Diagnosis not present

## 2018-06-29 DIAGNOSIS — H04123 Dry eye syndrome of bilateral lacrimal glands: Secondary | ICD-10-CM | POA: Diagnosis not present

## 2018-06-29 DIAGNOSIS — H0100A Unspecified blepharitis right eye, upper and lower eyelids: Secondary | ICD-10-CM | POA: Diagnosis not present

## 2018-08-22 DIAGNOSIS — Z23 Encounter for immunization: Secondary | ICD-10-CM | POA: Diagnosis not present

## 2018-12-28 DIAGNOSIS — H524 Presbyopia: Secondary | ICD-10-CM | POA: Diagnosis not present

## 2018-12-28 DIAGNOSIS — H0288A Meibomian gland dysfunction right eye, upper and lower eyelids: Secondary | ICD-10-CM | POA: Diagnosis not present

## 2018-12-28 DIAGNOSIS — H35373 Puckering of macula, bilateral: Secondary | ICD-10-CM | POA: Diagnosis not present

## 2018-12-28 DIAGNOSIS — H0288B Meibomian gland dysfunction left eye, upper and lower eyelids: Secondary | ICD-10-CM | POA: Diagnosis not present

## 2018-12-28 DIAGNOSIS — H26492 Other secondary cataract, left eye: Secondary | ICD-10-CM | POA: Diagnosis not present

## 2018-12-28 DIAGNOSIS — H02834 Dermatochalasis of left upper eyelid: Secondary | ICD-10-CM | POA: Diagnosis not present

## 2018-12-28 DIAGNOSIS — H52203 Unspecified astigmatism, bilateral: Secondary | ICD-10-CM | POA: Diagnosis not present

## 2018-12-28 DIAGNOSIS — H43813 Vitreous degeneration, bilateral: Secondary | ICD-10-CM | POA: Diagnosis not present

## 2018-12-28 DIAGNOSIS — H0100A Unspecified blepharitis right eye, upper and lower eyelids: Secondary | ICD-10-CM | POA: Diagnosis not present

## 2018-12-28 DIAGNOSIS — H5203 Hypermetropia, bilateral: Secondary | ICD-10-CM | POA: Diagnosis not present

## 2018-12-28 DIAGNOSIS — H04123 Dry eye syndrome of bilateral lacrimal glands: Secondary | ICD-10-CM | POA: Diagnosis not present

## 2018-12-28 DIAGNOSIS — H02831 Dermatochalasis of right upper eyelid: Secondary | ICD-10-CM | POA: Diagnosis not present

## 2019-05-15 DIAGNOSIS — Z8262 Family history of osteoporosis: Secondary | ICD-10-CM | POA: Diagnosis not present

## 2019-05-15 DIAGNOSIS — Z1231 Encounter for screening mammogram for malignant neoplasm of breast: Secondary | ICD-10-CM | POA: Diagnosis not present

## 2019-05-15 DIAGNOSIS — M810 Age-related osteoporosis without current pathological fracture: Secondary | ICD-10-CM | POA: Diagnosis not present

## 2019-05-15 DIAGNOSIS — Z90710 Acquired absence of both cervix and uterus: Secondary | ICD-10-CM | POA: Diagnosis not present

## 2019-05-15 DIAGNOSIS — Z8269 Family history of other diseases of the musculoskeletal system and connective tissue: Secondary | ICD-10-CM | POA: Diagnosis not present

## 2019-05-30 DIAGNOSIS — Z131 Encounter for screening for diabetes mellitus: Secondary | ICD-10-CM | POA: Diagnosis not present

## 2019-05-30 DIAGNOSIS — I739 Peripheral vascular disease, unspecified: Secondary | ICD-10-CM | POA: Diagnosis not present

## 2019-05-30 DIAGNOSIS — M1990 Unspecified osteoarthritis, unspecified site: Secondary | ICD-10-CM | POA: Diagnosis not present

## 2019-05-30 DIAGNOSIS — Z0000 Encounter for general adult medical examination without abnormal findings: Secondary | ICD-10-CM | POA: Diagnosis not present

## 2019-05-30 DIAGNOSIS — K279 Peptic ulcer, site unspecified, unspecified as acute or chronic, without hemorrhage or perforation: Secondary | ICD-10-CM | POA: Diagnosis not present

## 2019-05-30 DIAGNOSIS — M810 Age-related osteoporosis without current pathological fracture: Secondary | ICD-10-CM | POA: Diagnosis not present

## 2019-05-30 DIAGNOSIS — Z23 Encounter for immunization: Secondary | ICD-10-CM | POA: Diagnosis not present

## 2019-05-30 DIAGNOSIS — I700 Atherosclerosis of aorta: Secondary | ICD-10-CM | POA: Diagnosis not present

## 2019-05-30 DIAGNOSIS — Z8673 Personal history of transient ischemic attack (TIA), and cerebral infarction without residual deficits: Secondary | ICD-10-CM | POA: Diagnosis not present

## 2019-05-30 DIAGNOSIS — E7800 Pure hypercholesterolemia, unspecified: Secondary | ICD-10-CM | POA: Diagnosis not present

## 2019-05-30 DIAGNOSIS — Z79899 Other long term (current) drug therapy: Secondary | ICD-10-CM | POA: Diagnosis not present

## 2019-05-30 DIAGNOSIS — R202 Paresthesia of skin: Secondary | ICD-10-CM | POA: Diagnosis not present

## 2019-06-19 DIAGNOSIS — H35373 Puckering of macula, bilateral: Secondary | ICD-10-CM | POA: Diagnosis not present

## 2019-08-25 DIAGNOSIS — I639 Cerebral infarction, unspecified: Secondary | ICD-10-CM

## 2019-08-25 HISTORY — DX: Cerebral infarction, unspecified: I63.9

## 2019-09-22 ENCOUNTER — Inpatient Hospital Stay (HOSPITAL_COMMUNITY)
Admission: EM | Admit: 2019-09-22 | Discharge: 2019-09-28 | DRG: 378 | Disposition: A | Discharge status: Home / Self Care | Payer: Medicare HMO | Attending: Internal Medicine | Admitting: Internal Medicine

## 2019-09-22 ENCOUNTER — Inpatient Hospital Stay (HOSPITAL_COMMUNITY): Payer: Medicare HMO

## 2019-09-22 ENCOUNTER — Encounter (HOSPITAL_COMMUNITY): Payer: Self-pay

## 2019-09-22 ENCOUNTER — Emergency Department (HOSPITAL_COMMUNITY): Payer: Medicare HMO

## 2019-09-22 ENCOUNTER — Other Ambulatory Visit: Payer: Self-pay

## 2019-09-22 DIAGNOSIS — Z87891 Personal history of nicotine dependence: Secondary | ICD-10-CM

## 2019-09-22 DIAGNOSIS — K922 Gastrointestinal hemorrhage, unspecified: Secondary | ICD-10-CM

## 2019-09-22 DIAGNOSIS — Z79899 Other long term (current) drug therapy: Secondary | ICD-10-CM

## 2019-09-22 DIAGNOSIS — K921 Melena: Secondary | ICD-10-CM | POA: Diagnosis not present

## 2019-09-22 DIAGNOSIS — K635 Polyp of colon: Secondary | ICD-10-CM | POA: Diagnosis not present

## 2019-09-22 DIAGNOSIS — K5900 Constipation, unspecified: Secondary | ICD-10-CM | POA: Diagnosis not present

## 2019-09-22 DIAGNOSIS — K5731 Diverticulosis of large intestine without perforation or abscess with bleeding: Principal | ICD-10-CM | POA: Diagnosis present

## 2019-09-22 DIAGNOSIS — Z90710 Acquired absence of both cervix and uterus: Secondary | ICD-10-CM

## 2019-09-22 DIAGNOSIS — S0990XA Unspecified injury of head, initial encounter: Secondary | ICD-10-CM | POA: Diagnosis not present

## 2019-09-22 DIAGNOSIS — Z8673 Personal history of transient ischemic attack (TIA), and cerebral infarction without residual deficits: Secondary | ICD-10-CM | POA: Diagnosis not present

## 2019-09-22 DIAGNOSIS — I959 Hypotension, unspecified: Secondary | ICD-10-CM | POA: Diagnosis present

## 2019-09-22 DIAGNOSIS — R531 Weakness: Secondary | ICD-10-CM | POA: Diagnosis not present

## 2019-09-22 DIAGNOSIS — E785 Hyperlipidemia, unspecified: Secondary | ICD-10-CM | POA: Diagnosis not present

## 2019-09-22 DIAGNOSIS — Z20822 Contact with and (suspected) exposure to covid-19: Secondary | ICD-10-CM | POA: Diagnosis present

## 2019-09-22 DIAGNOSIS — Z888 Allergy status to other drugs, medicaments and biological substances status: Secondary | ICD-10-CM | POA: Diagnosis not present

## 2019-09-22 DIAGNOSIS — R410 Disorientation, unspecified: Secondary | ICD-10-CM | POA: Diagnosis not present

## 2019-09-22 DIAGNOSIS — M810 Age-related osteoporosis without current pathological fracture: Secondary | ICD-10-CM | POA: Diagnosis present

## 2019-09-22 DIAGNOSIS — Z8042 Family history of malignant neoplasm of prostate: Secondary | ICD-10-CM | POA: Diagnosis not present

## 2019-09-22 DIAGNOSIS — R0902 Hypoxemia: Secondary | ICD-10-CM | POA: Diagnosis not present

## 2019-09-22 DIAGNOSIS — Z03818 Encounter for observation for suspected exposure to other biological agents ruled out: Secondary | ICD-10-CM | POA: Diagnosis not present

## 2019-09-22 DIAGNOSIS — Z823 Family history of stroke: Secondary | ICD-10-CM

## 2019-09-22 DIAGNOSIS — D123 Benign neoplasm of transverse colon: Secondary | ICD-10-CM | POA: Diagnosis not present

## 2019-09-22 DIAGNOSIS — Z7902 Long term (current) use of antithrombotics/antiplatelets: Secondary | ICD-10-CM | POA: Diagnosis not present

## 2019-09-22 DIAGNOSIS — Z833 Family history of diabetes mellitus: Secondary | ICD-10-CM | POA: Diagnosis not present

## 2019-09-22 DIAGNOSIS — K644 Residual hemorrhoidal skin tags: Secondary | ICD-10-CM | POA: Diagnosis not present

## 2019-09-22 DIAGNOSIS — Z8249 Family history of ischemic heart disease and other diseases of the circulatory system: Secondary | ICD-10-CM | POA: Diagnosis not present

## 2019-09-22 DIAGNOSIS — D62 Acute posthemorrhagic anemia: Secondary | ICD-10-CM | POA: Diagnosis not present

## 2019-09-22 DIAGNOSIS — R58 Hemorrhage, not elsewhere classified: Secondary | ICD-10-CM | POA: Diagnosis not present

## 2019-09-22 DIAGNOSIS — Z825 Family history of asthma and other chronic lower respiratory diseases: Secondary | ICD-10-CM

## 2019-09-22 DIAGNOSIS — D128 Benign neoplasm of rectum: Secondary | ICD-10-CM | POA: Diagnosis not present

## 2019-09-22 DIAGNOSIS — K5792 Diverticulitis of intestine, part unspecified, without perforation or abscess without bleeding: Secondary | ICD-10-CM | POA: Diagnosis not present

## 2019-09-22 DIAGNOSIS — D012 Carcinoma in situ of rectum: Secondary | ICD-10-CM | POA: Diagnosis present

## 2019-09-22 DIAGNOSIS — K5730 Diverticulosis of large intestine without perforation or abscess without bleeding: Secondary | ICD-10-CM | POA: Diagnosis not present

## 2019-09-22 DIAGNOSIS — Z86004 Personal history of in-situ neoplasm of other and unspecified digestive organs: Secondary | ICD-10-CM | POA: Diagnosis not present

## 2019-09-22 DIAGNOSIS — R29818 Other symptoms and signs involving the nervous system: Secondary | ICD-10-CM | POA: Diagnosis not present

## 2019-09-22 DIAGNOSIS — G459 Transient cerebral ischemic attack, unspecified: Secondary | ICD-10-CM | POA: Diagnosis not present

## 2019-09-22 DIAGNOSIS — R404 Transient alteration of awareness: Secondary | ICD-10-CM | POA: Diagnosis not present

## 2019-09-22 DIAGNOSIS — K621 Rectal polyp: Secondary | ICD-10-CM | POA: Diagnosis not present

## 2019-09-22 LAB — COMPREHENSIVE METABOLIC PANEL
ALT: 14 U/L (ref 0–44)
AST: 21 U/L (ref 15–41)
Albumin: 3.2 g/dL — ABNORMAL LOW (ref 3.5–5.0)
Alkaline Phosphatase: 33 U/L — ABNORMAL LOW (ref 38–126)
Anion gap: 12 (ref 5–15)
BUN: 23 mg/dL (ref 8–23)
CO2: 22 mmol/L (ref 22–32)
Calcium: 8.7 mg/dL — ABNORMAL LOW (ref 8.9–10.3)
Chloride: 108 mmol/L (ref 98–111)
Creatinine, Ser: 0.82 mg/dL (ref 0.44–1.00)
GFR calc Af Amer: >60 mL/min (ref >60)
GFR calc non Af Amer: >60 mL/min (ref >60)
Glucose, Bld: 139 mg/dL — ABNORMAL HIGH (ref 70–99)
Potassium: 4 mmol/L (ref 3.5–5.1)
Sodium: 142 mmol/L (ref 135–145)
Total Bilirubin: 0.5 mg/dL (ref 0.3–1.2)
Total Protein: 5.5 g/dL — ABNORMAL LOW (ref 6.5–8.1)

## 2019-09-22 LAB — CBC WITH DIFFERENTIAL/PLATELET
Abs Immature Granulocytes: 0.13 10*3/uL — ABNORMAL HIGH (ref 0.00–0.07)
Basophils Absolute: 0 10*3/uL (ref 0.0–0.1)
Basophils Relative: 0 %
Eosinophils Absolute: 0 10*3/uL (ref 0.0–0.5)
Eosinophils Relative: 0 %
HCT: 34 % — ABNORMAL LOW (ref 36.0–46.0)
Hemoglobin: 10.6 g/dL — ABNORMAL LOW (ref 12.0–15.0)
Immature Granulocytes: 1 %
Lymphocytes Relative: 3 %
Lymphs Abs: 0.4 10*3/uL — ABNORMAL LOW (ref 0.7–4.0)
MCH: 28.9 pg (ref 26.0–34.0)
MCHC: 31.2 g/dL (ref 30.0–36.0)
MCV: 92.6 fL (ref 80.0–100.0)
Monocytes Absolute: 0.3 10*3/uL (ref 0.1–1.0)
Monocytes Relative: 2 %
Neutro Abs: 13.9 10*3/uL — ABNORMAL HIGH (ref 1.7–7.7)
Neutrophils Relative %: 94 %
Platelets: 245 10*3/uL (ref 150–400)
RBC: 3.67 MIL/uL — ABNORMAL LOW (ref 3.87–5.11)
RDW: 12.5 % (ref 11.5–15.5)
WBC: 14.8 10*3/uL — ABNORMAL HIGH (ref 4.0–10.5)
nRBC: 0 % (ref 0.0–0.2)

## 2019-09-22 LAB — URINALYSIS, ROUTINE W REFLEX MICROSCOPIC
Bilirubin Urine: NEGATIVE
Glucose, UA: NEGATIVE mg/dL
Ketones, ur: 80 mg/dL — AB
Nitrite: NEGATIVE
Protein, ur: NEGATIVE mg/dL
Specific Gravity, Urine: >1.046 — ABNORMAL HIGH (ref 1.005–1.030)
pH: 5 (ref 5.0–8.0)

## 2019-09-22 LAB — RESPIRATORY PANEL BY RT PCR (FLU A&B, COVID)
Influenza A by PCR: NEGATIVE
Influenza B by PCR: NEGATIVE
SARS Coronavirus 2 by RT PCR: NEGATIVE

## 2019-09-22 LAB — ABO/RH: ABO/RH(D): A POS

## 2019-09-22 LAB — LIPASE, BLOOD: Lipase: 39 U/L (ref 11–51)

## 2019-09-22 LAB — PROTIME-INR
INR: 1 (ref 0.8–1.2)
Prothrombin Time: 12.9 seconds (ref 11.4–15.2)

## 2019-09-22 LAB — APTT: aPTT: 23 seconds — ABNORMAL LOW (ref 24–36)

## 2019-09-22 MED ORDER — IOHEXOL 300 MG/ML  SOLN
75.0000 mL | Freq: Once | INTRAMUSCULAR | Status: AC | PRN
  Administered 2019-09-22: 19:00:00 75 mL via INTRAVENOUS

## 2019-09-22 MED ORDER — SODIUM CHLORIDE 0.9 % IV BOLUS
500.0000 mL | Freq: Once | INTRAVENOUS | Status: AC
  Administered 2019-09-22: 500 mL via INTRAVENOUS

## 2019-09-22 MED ORDER — LORAZEPAM 2 MG/ML IJ SOLN
0.5000 mg | Freq: Once | INTRAMUSCULAR | Status: AC
  Administered 2019-09-22: 0.5 mg via INTRAVENOUS
  Filled 2019-09-22: qty 1

## 2019-09-22 MED ORDER — SODIUM CHLORIDE 0.9 % IV BOLUS
500.0000 mL | Freq: Once | INTRAVENOUS | Status: AC
  Administered 2019-09-22: 21:00:00 500 mL via INTRAVENOUS

## 2019-09-22 NOTE — ED Notes (Signed)
Please call son Jerricka Sowders at [phone] for an update

## 2019-09-22 NOTE — ED Provider Notes (Signed)
[facility] Provider Note   CSN: [id number] Arrival date & time: 09/22/19  1457     History Chief Complaint  Patient presents with  . GI Bleeding    Jill Pitts is a 86 y.o. female with history of diverticulitis, hyperlipidemia, TIA presents for evaluation of acute onset, persistent bright red blood per rectum noted earlier this morning.  Jill Pitts reports that Jill Pitts awoke at around 8 AM in her usual state of health and felt as though Jill Pitts needed to go urinate so Jill Pitts went to the bathroom.  Jill Pitts then went back to sleep and awoke at 10 AM feeling as though Jill Pitts needed to have a bowel movement.  Jill Pitts went to the bathroom and states that Jill Pitts had a large amount of bright red blood in the commode as well as watery stool.  Jill Pitts then went back to sleep and awoke again an hour or so later with recurrent feeling of needing to have a bowel movement but noted feeling "woozy" upon standing.  Jill Pitts had another episode of bright red blood with her bowel movement.  Denies urinary symptoms.  Reports intermittent sharp pains to the left lower quadrant which Jill Pitts states is a "due to my diverticulosis" which Jill Pitts experiences occasionally.  Jill Pitts cannot tell me when it started up again.  Jill Pitts denies fever, nausea, or vomiting.  No chest pain or shortness of breath.  Jill Pitts is currently anticoagulated on Plavix and has been taking her medications as prescribed.   The history is provided by the patient.       Past Medical History:  Diagnosis Date  . Degenerative disc disease, cervical   . Diverticulitis   . Eczema   . Headache 10/25/2014  . Hyperlipidemia   . Osteoarthritis   . Osteoporosis   . Seborrheic dermatitis   . Sinusitis   . Subjective visual disturbance 10/25/2014  . TIA (transient ischemic attack)   . Tinnitus     Patient Active Problem List   Diagnosis Date Noted  . Acute GI bleeding 09/22/2019  . Cervical spondylosis without myelopathy 04/10/2016  . Subjective visual  disturbance 10/25/2014  . Headache 10/25/2014    Past Surgical History:  Procedure Laterality Date  . ABDOMINAL HYSTERECTOMY    . APPENDECTOMY  1981  . CARPAL TUNNEL RELEASE Right 1998  . CATARACT EXTRACTION Bilateral   . NASAL POLYP SURGERY    . TONSILLECTOMY AND ADENOIDECTOMY       OB History   No obstetric history on file.     Family History  Problem Relation Age of Onset  . Heart disease Father   . Prostate cancer Father   . Asthma Sister   . Hypertension Sister   . Diabetes Sister   . Stroke Brother     Social History   Tobacco Use  . Smoking status: Former Research scientist (life sciences)  . Smokeless tobacco: Never Used  Substance Use Topics  . Alcohol use: Yes    Alcohol/week: 0.0 standard drinks    Comment: very rare 1-2 glasses wine per year  . Drug use: No    Home Medications Prior to Admission medications   Medication Sig Start Date End Date Taking? Authorizing Provider  Biotin 1 MG CAPS Take 10,000 mg by mouth daily.   Yes [provider]  Calcium Carb-Cholecalciferol (CALCIUM + D3) 600-200 MG-UNIT TABS Take 1 tablet by mouth 2 (two) times daily.   Yes [provider]  clopidogrel (PLAVIX) 75 MG tablet Take 75 mg by  mouth daily.   Yes [provider]  Co-Enzyme Q-10 30 MG CAPS Take 30 mg by mouth.   Yes [provider]  Fish Oil OIL Take 1,400 mg by mouth 2 (two) times daily.   Yes [provider]  lovastatin (MEVACOR) 20 MG tablet Take 20 mg by mouth daily. 08/28/14  Yes [provider]  Multiple Vitamins-Minerals (MULTIVITAMIN ADULT PO) Take 1 tablet by mouth daily.   Yes [provider]    Allergies    Gabapentin  Review of Systems   Review of Systems  Constitutional: Positive for fatigue. Negative for chills and fever.  Respiratory: Negative for shortness of breath.   Cardiovascular: Negative for chest pain.  Gastrointestinal: Positive for abdominal pain, blood in stool and diarrhea. Negative for nausea  and vomiting.  Neurological: Positive for light-headedness. Negative for syncope.    Physical Exam Updated Vital Signs BP (!) 140/56   Pulse 92   Temp 97.8 F (36.6 C) (Rectal)   Resp 17   SpO2 97%   Physical Exam Vitals and nursing note reviewed.  Constitutional:      General: Jill Pitts is not in acute distress.    Appearance: Jill Pitts is well-developed.  HENT:     Head: Normocephalic and atraumatic.  Eyes:     General:        Right eye: No discharge.        Left eye: No discharge.     Conjunctiva/sclera: Conjunctivae normal.  Neck:     Vascular: No JVD.     Trachea: No tracheal deviation.  Cardiovascular:     Rate and Rhythm: Normal rate and regular rhythm.  Pulmonary:     Effort: Pulmonary effort is normal.     Breath sounds: Normal breath sounds.  Abdominal:     General: Abdomen is flat. Bowel sounds are normal. There is no distension.     Palpations: Abdomen is soft.     Tenderness: There is abdominal tenderness in the epigastric area, periumbilical area, suprapubic area and left upper quadrant. There is no right CVA tenderness, left CVA tenderness, guarding or rebound.  Genitourinary:    Comments: Examination performed in the presence of a chaperone.  Patient with frank rectal bleeding.  Few external hemorrhoids which are not thrombosed or tender. Musculoskeletal:     Cervical back: Normal range of motion and neck supple.  Skin:    General: Skin is warm and dry.     Findings: No erythema.  Neurological:     Mental Status: Jill Pitts is alert.     Comments: Fluent speech with no evidence of dysarthria or aphasia  Psychiatric:        Behavior: Behavior normal.     ED Results / Procedures / Treatments   Labs (all labs ordered are listed, but only abnormal results are displayed) Labs Reviewed  COMPREHENSIVE METABOLIC PANEL - Abnormal; Notable for the following components:      Result Value   Glucose, Bld 139 (*)    Calcium 8.7 (*)    Total Protein 5.5 (*)    Albumin 3.2  (*)    Alkaline Phosphatase 33 (*)    All other components within normal limits  CBC WITH DIFFERENTIAL/PLATELET - Abnormal; Notable for the following components:   WBC 14.8 (*)    RBC 3.67 (*)    Hemoglobin 10.6 (*)    HCT 34.0 (*)    Neutro Abs 13.9 (*)    Lymphs Abs 0.4 (*)    Abs  Immature Granulocytes 0.13 (*)    All other components within normal limits  URINALYSIS, ROUTINE W REFLEX MICROSCOPIC - Abnormal; Notable for the following components:   Specific Gravity, Urine >1.046 (*)    Hgb urine dipstick MODERATE (*)    Ketones, ur 80 (*)    Leukocytes,Ua TRACE (*)    Bacteria, UA RARE (*)    All other components within normal limits  APTT - Abnormal; Notable for the following components:   aPTT 23 (*)    All other components within normal limits  RESPIRATORY PANEL BY RT PCR (FLU A&B, COVID)  LIPASE, BLOOD  PROTIME-INR  TYPE AND SCREEN  ABO/RH    EKG None  Radiology CT Head Wo Contrast  Result Date: 09/22/2019 CLINICAL DATA:  86 year old female with fall.  Concern for TIA. EXAM: CT HEAD WITHOUT CONTRAST TECHNIQUE: Contiguous axial images were obtained from the base of the skull through the vertex without intravenous contrast. COMPARISON:  None FINDINGS: Brain: Moderate age-related atrophy and chronic microvascular ischemic changes. There is no acute intracranial hemorrhage. No mass effect or midline shift no extra-axial fluid collection. Vascular: No hyperdense vessel or unexpected calcification. Skull: Normal. Negative for fracture or focal lesion. Sinuses/Orbits: There is complete opacification of the right frontal sinus and multiple ethmoid air cells. No air-fluid level. Small bilateral maxillary sinus retention cysts or polyps noted. The mastoid air cells are clear. Other: None IMPRESSION: 1. No acute intracranial hemorrhage. 2. Age-related atrophy and chronic microvascular ischemic changes. Electronically Signed   By: Anner Crete M.D.   On: 09/22/2019 19:36   MR BRAIN  WO CONTRAST  Result Date: 09/22/2019 CLINICAL DATA:  GI bleeding. Anticoagulated. Focal neurological deficit. Fell. EXAM: MRI HEAD WITHOUT CONTRAST TECHNIQUE: Multiplanar, multiecho pulse sequences of the brain and surrounding structures were obtained without intravenous contrast. COMPARISON:  Head CT same day.  MRI 11/19/2014. FINDINGS: Brain: Diffusion imaging does not show any acute or subacute infarction. No focal finding affects the brainstem or cerebellum. Cerebral hemispheres show pronounced confluent changes of small vessel disease throughout the deep and subcortical white matter. There is age related generalized atrophy. No sign of large vessel infarction. No mass lesion, hemorrhage, hydrocephalus or extra-axial collection. Vascular: Major vessels at the base of the brain show flow. Skull and upper cervical spine: Negative Sinuses/Orbits: Opacification of the right frontal and anterior ethmoid sinuses. Other paranasal sinuses are clear. Orbits are negative. Previous lens implants. Other: Question nasal polyp on the left versus inflamed or engorged turbinates. IMPRESSION: No acute intracranial finding. Age related atrophy. Advanced chronic small-vessel ischemic changes affecting the cerebral hemispheric white matter. Opacified right frontal and anterior ethmoid sinuses. Question left nasal polyp versus engorged turbinate. Electronically Signed   By: Nelson Chimes M.D.   On: 09/22/2019 22:48   CT ABDOMEN PELVIS W CONTRAST  Result Date: 09/22/2019 CLINICAL DATA:  Diverticulitis. EXAM: CT ABDOMEN AND PELVIS WITH CONTRAST TECHNIQUE: Multidetector CT imaging of the abdomen and pelvis was performed using the standard protocol following bolus administration of intravenous contrast. CONTRAST:  99mL OMNIPAQUE IOHEXOL 300 MG/ML  SOLN COMPARISON:  None. FINDINGS: Lower chest: The lung bases are clear. The heart size is normal. Hepatobiliary: The liver is normal. Normal gallbladder.There is no biliary ductal  dilation. Pancreas: Normal contours without ductal dilatation. No peripancreatic fluid collection. Spleen: No splenic laceration or hematoma. Adrenals/Urinary Tract: --Adrenal glands: No adrenal hemorrhage. --Right kidney/ureter: No hydronephrosis or perinephric hematoma. --Left kidney/ureter: No hydronephrosis or perinephric hematoma. --Urinary bladder: Unremarkable. Stomach/Bowel: --Stomach/Duodenum: No hiatal hernia  or other gastric abnormality. Normal duodenal course and caliber. --Small bowel: No dilatation or inflammation. --Colon: Rectosigmoid diverticulosis without acute inflammation. --Appendix: Normal. Vascular/Lymphatic: Atherosclerotic calcification is present within the non-aneurysmal abdominal aorta, without hemodynamically significant stenosis. --No retroperitoneal lymphadenopathy. --No mesenteric lymphadenopathy. --No pelvic or inguinal lymphadenopathy. Reproductive: Status post hysterectomy. No adnexal mass. Other: No ascites or free air. There is a small fat containing umbilical hernia. Musculoskeletal. There is a prominent Schmorl's node involving the superior endplate of the L4 vertebral body. Multilevel degenerative changes are noted throughout the visualized thoracolumbar spine without evidence for definite acute displaced fracture. IMPRESSION: Rectosigmoid diverticulosis without acute inflammation. Aortic Atherosclerosis (ICD10-I70.0). Electronically Signed   By: Constance Holster M.D.   On: 09/22/2019 19:15    Procedures Procedures (including critical care time)  Medications Ordered in ED Medications  sodium chloride 0.9 % bolus 500 mL (0 mLs Intravenous Stopped 09/22/19 1855)  sodium chloride 0.9 % bolus 500 mL (0 mLs Intravenous Stopped 09/22/19 2041)  iohexol (OMNIPAQUE) 300 MG/ML solution 75 mL (75 mLs Intravenous Contrast Given 09/22/19 1841)  sodium chloride 0.9 % bolus 500 mL (500 mLs Intravenous New Bag/Given 09/22/19 2049)  LORazepam (ATIVAN) injection 0.5 mg (0.5 mg Intravenous  Given 09/22/19 2206)    ED Course  I have reviewed the triage vital signs and the nursing notes.  Pertinent labs & imaging results that were available during my care of the patient were reviewed by me and considered in my medical decision making (see chart for details).    MDM Rules/Calculators/A&P                      Patient presenting for evaluation of bright red blood per rectum beginning today.  Jill Pitts is afebrile, blood pressures are soft but improved after IV fluid boluses.  Jill Pitts is nontoxic in appearance.  Jill Pitts has left-sided abdominal pains on examination but states this is not necessarily unusual for her given her history of diverticulitis.  On exam Jill Pitts has frank rectal bleeding.  Spoke with patient's son to obtain collateral information. States the patent lives with him. Earlier this morning he was in the living room when he heard the patient calling out for him.  When he found the patient downstairs he noted blood from her bathroom to the bedroom.  He noted that Jill Pitts was confused and had difficulty forming words.  The symptoms lasted around 15 minutes from when the patient's son found her until EMS arrived and he noted that her speech had improved and her cognitive responses also improved.  He is concerned Jill Pitts may have had a TIA.  On my assessment speech is fluent and goal oriented.  Moves all extremities spontaneously and cranial nerves appear grossly intact.  Jill Pitts has no focal neurologic deficits.  We will obtain a head CT but I have a low suspicion of CVA at this time.  Jill Pitts could have possibly had a TIA though I suspect what is more likely is that Jill Pitts was temporarily altered due to hypotension after standing.  Lab work reviewed by me shows leukocytosis of 14.8, anemia of 10.6 but I do not know what her baseline is.  No renal insufficiency, no metabolic derangements.  UA suggests dehydration with elevated specific gravity and ketones.  Less suggestive of UTI.  Has some blood in her urine though  I suspect this is likely contaminant from her rectal bleeding.  Imaging shows no acute intracranial abnormalities.  Jill Pitts has diverticulosis but no evidence of acute diverticulitis.  No other acute surgical abdominal pathology noted on imaging today.  7:57 PM CONSULT: Spoke with Dr. Michail Sermon with GI, plan for likely scope in the morning.  GI will assess in the a.m. he suspects the patient has a diverticular bleed.  Spoke with Dr. Hal Hope with [facility] hospitalist service who agrees to assume care of patient and bring her into the hospital for further evaluation and management.  Final Clinical Impression(s) / ED Diagnoses Final diagnoses:  Acute GI bleeding    Rx / DC Orders ED Discharge Orders    None       Debroah Baller 09/22/19 2307    Sherwood Gambler, MD 09/26/19 [id number]

## 2019-09-22 NOTE — ED Triage Notes (Signed)
Pt bib [facility] from w/ c/o gi bleeding. Pt had 2 episodes of bright red bloody stools. Pt takes plavix for hx of TIAs. Pt AOx4.  EMS VSS: BP 130/80 RR 16 SPO2 1005 on RA HR 110

## 2019-09-23 ENCOUNTER — Encounter (HOSPITAL_COMMUNITY): Payer: Self-pay | Admitting: Internal Medicine

## 2019-09-23 DIAGNOSIS — R410 Disorientation, unspecified: Secondary | ICD-10-CM

## 2019-09-23 DIAGNOSIS — K922 Gastrointestinal hemorrhage, unspecified: Secondary | ICD-10-CM

## 2019-09-23 DIAGNOSIS — D62 Acute posthemorrhagic anemia: Secondary | ICD-10-CM

## 2019-09-23 LAB — CBC
HCT: 24.8 % — ABNORMAL LOW (ref 36.0–46.0)
HCT: 23.7 % — ABNORMAL LOW (ref 36.0–46.0)
HCT: 23.1 % — ABNORMAL LOW (ref 36.0–46.0)
HCT: 32 % — ABNORMAL LOW (ref 36.0–46.0)
HCT: 29.3 % — ABNORMAL LOW (ref 36.0–46.0)
Hemoglobin: 8.1 g/dL — ABNORMAL LOW (ref 12.0–15.0)
Hemoglobin: 7.6 g/dL — ABNORMAL LOW (ref 12.0–15.0)
Hemoglobin: 7.4 g/dL — ABNORMAL LOW (ref 12.0–15.0)
Hemoglobin: 10.4 g/dL — ABNORMAL LOW (ref 12.0–15.0)
Hemoglobin: 9.7 g/dL — ABNORMAL LOW (ref 12.0–15.0)
MCH: 29.1 pg (ref 26.0–34.0)
MCH: 28.9 pg (ref 26.0–34.0)
MCH: 29 pg (ref 26.0–34.0)
MCH: 29.5 pg (ref 26.0–34.0)
MCH: 29.7 pg (ref 26.0–34.0)
MCHC: 32.7 g/dL (ref 30.0–36.0)
MCHC: 32.1 g/dL (ref 30.0–36.0)
MCHC: 32 g/dL (ref 30.0–36.0)
MCHC: 32.5 g/dL (ref 30.0–36.0)
MCHC: 33.1 g/dL (ref 30.0–36.0)
MCV: 89.2 fL (ref 80.0–100.0)
MCV: 90.1 fL (ref 80.0–100.0)
MCV: 90.6 fL (ref 80.0–100.0)
MCV: 90.9 fL (ref 80.0–100.0)
MCV: 89.6 fL (ref 80.0–100.0)
Platelets: 179 10*3/uL (ref 150–400)
Platelets: 184 10*3/uL (ref 150–400)
Platelets: 167 10*3/uL (ref 150–400)
Platelets: 179 10*3/uL (ref 150–400)
Platelets: 180 10*3/uL (ref 150–400)
RBC: 2.78 MIL/uL — ABNORMAL LOW (ref 3.87–5.11)
RBC: 2.63 MIL/uL — ABNORMAL LOW (ref 3.87–5.11)
RBC: 2.55 MIL/uL — ABNORMAL LOW (ref 3.87–5.11)
RBC: 3.52 MIL/uL — ABNORMAL LOW (ref 3.87–5.11)
RBC: 3.27 MIL/uL — ABNORMAL LOW (ref 3.87–5.11)
RDW: 12.5 % (ref 11.5–15.5)
RDW: 12.8 % (ref 11.5–15.5)
RDW: 12.7 % (ref 11.5–15.5)
RDW: 12.8 % (ref 11.5–15.5)
RDW: 12.7 % (ref 11.5–15.5)
WBC: 8.7 10*3/uL (ref 4.0–10.5)
WBC: 7.7 10*3/uL (ref 4.0–10.5)
WBC: 6.1 10*3/uL (ref 4.0–10.5)
WBC: 6.9 10*3/uL (ref 4.0–10.5)
WBC: 7.3 10*3/uL (ref 4.0–10.5)
nRBC: 0 % (ref 0.0–0.2)
nRBC: 0 % (ref 0.0–0.2)
nRBC: 0 % (ref 0.0–0.2)
nRBC: 0 % (ref 0.0–0.2)
nRBC: 0 % (ref 0.0–0.2)

## 2019-09-23 LAB — BASIC METABOLIC PANEL
Anion gap: 6 (ref 5–15)
BUN: 14 mg/dL (ref 8–23)
CO2: 25 mmol/L (ref 22–32)
Calcium: 8 mg/dL — ABNORMAL LOW (ref 8.9–10.3)
Chloride: 112 mmol/L — ABNORMAL HIGH (ref 98–111)
Creatinine, Ser: 0.62 mg/dL (ref 0.44–1.00)
GFR calc Af Amer: >60 mL/min (ref >60)
GFR calc non Af Amer: >60 mL/min (ref >60)
Glucose, Bld: 105 mg/dL — ABNORMAL HIGH (ref 70–99)
Potassium: 3.7 mmol/L (ref 3.5–5.1)
Sodium: 143 mmol/L (ref 135–145)

## 2019-09-23 LAB — PREPARE RBC (CROSSMATCH)

## 2019-09-23 MED ORDER — ACETAMINOPHEN 650 MG RE SUPP: 650.0000 mg | Freq: Four times a day (QID) | RECTAL | Status: DC | PRN

## 2019-09-23 MED ORDER — SODIUM CHLORIDE 0.9 % IV SOLN: INTRAVENOUS | Status: AC

## 2019-09-23 MED ORDER — ONDANSETRON HCL 4 MG PO TABS: 4.0000 mg | ORAL_TABLET | Freq: Four times a day (QID) | ORAL | Status: DC | PRN

## 2019-09-23 MED ORDER — ACETAMINOPHEN 325 MG PO TABS: 650.0000 mg | ORAL_TABLET | Freq: Four times a day (QID) | ORAL | Status: DC | PRN

## 2019-09-23 MED ORDER — SODIUM CHLORIDE 0.9% IV SOLUTION: Freq: Once | INTRAVENOUS | Status: AC

## 2019-09-23 MED ORDER — ONDANSETRON HCL 4 MG/2ML IJ SOLN
4.0000 mg | Freq: Four times a day (QID) | INTRAMUSCULAR | Status: DC | PRN
  Administered 2019-09-25: 08:00:00 4 mg via INTRAVENOUS
  Filled 2019-09-23: qty 2

## 2019-09-23 NOTE — Plan of Care (Signed)
Poc progressing.  

## 2019-09-23 NOTE — Progress Notes (Signed)
Patient seen and examined.  87 year old female, functional and lives at home with history of TIA on Plavix and statins, history of diverticulosis presented to the emergency room with 2 large bloody bowel movements and dizziness and confusion. In the emergency room, hemodynamically stable.  Hemoglobin 10.6.  CT abdomen unremarkable.  CT head and MRI brain normal.  #1. acute blood loss anemia due to lower GI bleeding: Hemoglobin 10.6-8.6-7.6-7.4.  Currently her blood pressures are stable. With significant drop in hemoglobin more than 3 points and ongoing blood loss, will transfuse 1 unit of PRBC today to keep hemoglobin more than 8. Patient consented for PRBC transfusion. Maintenance IV fluids. Plavix on hold. N.p.o. H&H every 6 hours,  GI consulted.  Await their recommendation whether she will need colonoscopy today. I called and updated patient's son over the phone about plan of care, transfusions and possible colonoscopy by gastroenterology.

## 2019-09-23 NOTE — H&P (Signed)
History and Physical    Jill Pitts [id number] DOB: 1928-10-07 DOA: 09/22/2019  PCP: Orpah Melter, MD  Patient coming from: Home.  Chief Complaint: Rectal bleeding.  HPI: Jill Pitts is a 86 y.o. female with history of TIA on Plavix and statins was brought to the ER after patient had rectal bleeding.  Patient states that her symptoms started around 11 AM yesterday.  Had a large bowel movement which was bloody and had another one around 1 hour later.  Following the second episode patient felt very weak and almost passed out.  Patient's son noted that patient was confused and had some difficulty bringing out words at this point patient's son called EMS and the patient was brought to the ER.  ED Course: In the ER patient's vitals are stable hemoglobin is around 10.6 no old labs to compare.  Patient complained of some abdominal discomfort CT abdomen was unremarkable except for diverticulosis.  CT head and MRI brain was unremarkable.  ER physician discussed with Dr. Michail Sermon on-call gastroenterologist who advised admission and will be seeing patient in consult.  Covid test was negative.  Review of Systems: As per HPI, rest all negative.   Past Medical History:  Diagnosis Date  . Degenerative disc disease, cervical   . Diverticulitis   . Eczema   . Headache 10/25/2014  . Hyperlipidemia   . Osteoarthritis   . Osteoporosis   . Seborrheic dermatitis   . Sinusitis   . Subjective visual disturbance 10/25/2014  . TIA (transient ischemic attack)   . Tinnitus     Past Surgical History:  Procedure Laterality Date  . ABDOMINAL HYSTERECTOMY    . APPENDECTOMY  1981  . CARPAL TUNNEL RELEASE Right 1998  . CATARACT EXTRACTION Bilateral   . NASAL POLYP SURGERY    . TONSILLECTOMY AND ADENOIDECTOMY       reports that she has quit smoking. She has never used smokeless tobacco. She reports current alcohol use. She reports that she does not use drugs.  Allergies  Allergen Reactions  .  Gabapentin     Tachycardia    Family History  Problem Relation Age of Onset  . Heart disease Father   . Prostate cancer Father   . Asthma Sister   . Hypertension Sister   . Diabetes Sister   . Stroke Brother     Prior to Admission medications   Medication Sig Start Date End Date Taking? Authorizing Provider  Biotin 1 MG CAPS Take 10,000 mg by mouth daily.   Yes [provider]  Calcium Carb-Cholecalciferol (CALCIUM + D3) 600-200 MG-UNIT TABS Take 1 tablet by mouth 2 (two) times daily.   Yes [provider]  clopidogrel (PLAVIX) 75 MG tablet Take 75 mg by mouth daily.   Yes [provider]  Co-Enzyme Q-10 30 MG CAPS Take 30 mg by mouth.   Yes [provider]  Fish Oil OIL Take 1,400 mg by mouth 2 (two) times daily.   Yes [provider]  lovastatin (MEVACOR) 20 MG tablet Take 20 mg by mouth daily. 08/28/14  Yes [provider]  Multiple Vitamins-Minerals (MULTIVITAMIN ADULT PO) Take 1 tablet by mouth daily.   Yes [provider]    Physical Exam: Constitutional: Moderately built and nourished. Vitals:   09/22/19 1800 09/22/19 1900 09/22/19 2100 09/22/19 2345  BP: (!) 108/54 (!) 140/56 (!) 125/54 125/60  Pulse: (!) 101 92 86   Resp: 16 17 (!) 24 18  Temp:  98.9 F (37.2 C)  TempSrc:    Oral  SpO2: 98% 97% 98% 98%  Weight:    43 kg   Eyes: Anicteric no pallor. ENMT: No discharge from the ears eyes nose or mouth. Neck: No mass or.  No neck rigidity. Respiratory: No rhonchi or crepitations. Cardiovascular: S1-S2 heard. Abdomen: Soft nontender bowel sounds present. Musculoskeletal: No edema.  No joint effusion. Skin: No rash. Neurologic: Alert awake oriented time place and person.  Moves all extremities. Psychiatric: Appears normal with normal affect.   Labs on Admission: I have personally reviewed following labs and imaging studies  CBC: Recent Labs  Lab 09/22/19 1532  WBC 14.8*  NEUTROABS 13.9*  HGB  10.6*  HCT 34.0*  MCV 92.6  PLT 99991111   Basic Metabolic Panel: Recent Labs  Lab 09/22/19 1532  NA 142  K 4.0  CL 108  CO2 22  GLUCOSE 139*  BUN 23  CREATININE 0.82  CALCIUM 8.7*   GFR: CrCl cannot be calculated (Unknown ideal weight.). Liver Function Tests: Recent Labs  Lab 09/22/19 1532  AST 21  ALT 14  ALKPHOS 33*  BILITOT 0.5  PROT 5.5*  ALBUMIN 3.2*   Recent Labs  Lab 09/22/19 1532  LIPASE 39   No results for input(s): AMMONIA in the last 168 hours. Coagulation Profile: Recent Labs  Lab 09/22/19 1543  INR 1.0   Cardiac Enzymes: No results for input(s): CKTOTAL, CKMB, CKMBINDEX, TROPONINI in the last 168 hours. BNP (last 3 results) No results for input(s): PROBNP in the last 8760 hours. HbA1C: No results for input(s): HGBA1C in the last 72 hours. CBG: No results for input(s): GLUCAP in the last 168 hours. Lipid Profile: No results for input(s): CHOL, HDL, LDLCALC, TRIG, CHOLHDL, LDLDIRECT in the last 72 hours. Thyroid Function Tests: No results for input(s): TSH, T4TOTAL, FREET4, T3FREE, THYROIDAB in the last 72 hours. Anemia Panel: No results for input(s): VITAMINB12, FOLATE, FERRITIN, TIBC, IRON, RETICCTPCT in the last 72 hours. Urine analysis:    Component Value Date/Time   COLORURINE YELLOW 09/22/2019 2035   APPEARANCEUR CLEAR 09/22/2019 2035   LABSPEC >1.046 (H) 09/22/2019 2035   PHURINE 5.0 09/22/2019 2035   GLUCOSEU NEGATIVE 09/22/2019 2035   HGBUR MODERATE (A) 09/22/2019 2035   BILIRUBINUR NEGATIVE 09/22/2019 2035   KETONESUR 80 (A) 09/22/2019 2035   PROTEINUR NEGATIVE 09/22/2019 2035   NITRITE NEGATIVE 09/22/2019 2035   LEUKOCYTESUR TRACE (A) 09/22/2019 2035   Sepsis Labs: @LABRCNTIP (procalcitonin:4,lacticidven:4) ) Recent Results (from the past 240 hour(s))  Respiratory Panel by RT PCR (Flu A&B, Covid) -     Status: None   Collection Time: 09/22/19  9:26 PM  Result Value Ref Range Status   SARS Coronavirus 2 by RT PCR NEGATIVE  NEGATIVE Final    Comment: (NOTE) SARS-CoV-2 target nucleic acids are NOT DETECTED. The SARS-CoV-2 RNA is generally detectable in upper respiratoy specimens during the acute phase of infection. The lowest concentration of SARS-CoV-2 viral copies this assay can detect is 131 copies/mL. A negative result does not preclude SARS-Cov-2 infection and should not be used as the sole basis for treatment or other patient management decisions. A negative result may occur with  improper specimen collection/handling, submission of specimen other than nasopharyngeal swab, presence of viral mutation(s) within the areas targeted by this assay, and inadequate number of viral copies (<131 copies/mL). A negative result must be combined with clinical observations, patient history, and epidemiological information. The expected result is Negative. Fact Sheet for Patients:  PinkCheek.be  Fact Sheet for Healthcare Providers:  GravelBags.it This test is not yet ap proved or cleared by the [place] FDA and  has been authorized for detection and/or diagnosis of SARS-CoV-2 by FDA under an Emergency Use Authorization (EUA). This EUA will remain  in effect (meaning this test can be used) for the duration of the COVID-19 declaration under Section 564(b)(1) of the Act, 21 U.S.C. section 360bbb-3(b)(1), unless the authorization is terminated or revoked sooner.    Influenza A by PCR NEGATIVE NEGATIVE Final   Influenza B by PCR NEGATIVE NEGATIVE Final    Comment: (NOTE) The Xpert Xpress SARS-CoV-2/FLU/RSV assay is intended as an aid in  the diagnosis of influenza from Nasopharyngeal swab specimens and  should not be used as a sole basis for treatment. Nasal washings and  aspirates are unacceptable for Xpert Xpress SARS-CoV-2/FLU/RSV  testing. Fact Sheet for Patients: PinkCheek.be Fact Sheet for Healthcare  Providers: GravelBags.it This test is not yet approved or cleared by the [place] FDA and  has been authorized for detection and/or diagnosis of SARS-CoV-2 by  FDA under an Emergency Use Authorization (EUA). This EUA will remain  in effect (meaning this test can be used) for the duration of the  Covid-19 declaration under Section 564(b)(1) of the Act, 21  U.S.C. section 360bbb-3(b)(1), unless the authorization is  terminated or revoked. Performed at [facility], [place] [address], [place], [place] [address]      Radiological Exams on Admission: CT Head Wo Contrast  Result Date: 09/22/2019 CLINICAL DATA:  85 year old female with fall.  Concern for TIA. EXAM: CT HEAD WITHOUT CONTRAST TECHNIQUE: Contiguous axial images were obtained from the base of the skull through the vertex without intravenous contrast. COMPARISON:  None FINDINGS: Brain: Moderate age-related atrophy and chronic microvascular ischemic changes. There is no acute intracranial hemorrhage. No mass effect or midline shift no extra-axial fluid collection. Vascular: No hyperdense vessel or unexpected calcification. Skull: Normal. Negative for fracture or focal lesion. Sinuses/Orbits: There is complete opacification of the right frontal sinus and multiple ethmoid air cells. No air-fluid level. Small bilateral maxillary sinus retention cysts or polyps noted. The mastoid air cells are clear. Other: None IMPRESSION: 1. No acute intracranial hemorrhage. 2. Age-related atrophy and chronic microvascular ischemic changes. Electronically Signed   By: Anner Crete M.D.   On: 09/22/2019 19:36   MR BRAIN WO CONTRAST  Result Date: 09/22/2019 CLINICAL DATA:  GI bleeding. Anticoagulated. Focal neurological deficit. Fell. EXAM: MRI HEAD WITHOUT CONTRAST TECHNIQUE: Multiplanar, multiecho pulse sequences of the brain and surrounding structures were obtained without intravenous contrast. COMPARISON:  Head CT same  day.  MRI 11/19/2014. FINDINGS: Brain: Diffusion imaging does not show any acute or subacute infarction. No focal finding affects the brainstem or cerebellum. Cerebral hemispheres show pronounced confluent changes of small vessel disease throughout the deep and subcortical white matter. There is age related generalized atrophy. No sign of large vessel infarction. No mass lesion, hemorrhage, hydrocephalus or extra-axial collection. Vascular: Major vessels at the base of the brain show flow. Skull and upper cervical spine: Negative Sinuses/Orbits: Opacification of the right frontal and anterior ethmoid sinuses. Other paranasal sinuses are clear. Orbits are negative. Previous lens implants. Other: Question nasal polyp on the left versus inflamed or engorged turbinates. IMPRESSION: No acute intracranial finding. Age related atrophy. Advanced chronic small-vessel ischemic changes affecting the cerebral hemispheric white matter. Opacified right frontal and anterior ethmoid sinuses. Question left nasal polyp versus engorged turbinate. Electronically Signed   By: Elta Guadeloupe  Shogry M.D.   On: 09/22/2019 22:48   CT ABDOMEN PELVIS W CONTRAST  Result Date: 09/22/2019 CLINICAL DATA:  Diverticulitis. EXAM: CT ABDOMEN AND PELVIS WITH CONTRAST TECHNIQUE: Multidetector CT imaging of the abdomen and pelvis was performed using the standard protocol following bolus administration of intravenous contrast. CONTRAST:  52mL OMNIPAQUE IOHEXOL 300 MG/ML  SOLN COMPARISON:  None. FINDINGS: Lower chest: The lung bases are clear. The heart size is normal. Hepatobiliary: The liver is normal. Normal gallbladder.There is no biliary ductal dilation. Pancreas: Normal contours without ductal dilatation. No peripancreatic fluid collection. Spleen: No splenic laceration or hematoma. Adrenals/Urinary Tract: --Adrenal glands: No adrenal hemorrhage. --Right kidney/ureter: No hydronephrosis or perinephric hematoma. --Left kidney/ureter: No hydronephrosis or  perinephric hematoma. --Urinary bladder: Unremarkable. Stomach/Bowel: --Stomach/Duodenum: No hiatal hernia or other gastric abnormality. Normal duodenal course and caliber. --Small bowel: No dilatation or inflammation. --Colon: Rectosigmoid diverticulosis without acute inflammation. --Appendix: Normal. Vascular/Lymphatic: Atherosclerotic calcification is present within the non-aneurysmal abdominal aorta, without hemodynamically significant stenosis. --No retroperitoneal lymphadenopathy. --No mesenteric lymphadenopathy. --No pelvic or inguinal lymphadenopathy. Reproductive: Status post hysterectomy. No adnexal mass. Other: No ascites or free air. There is a small fat containing umbilical hernia. Musculoskeletal. There is a prominent Schmorl's node involving the superior endplate of the L4 vertebral body. Multilevel degenerative changes are noted throughout the visualized thoracolumbar spine without evidence for definite acute displaced fracture. IMPRESSION: Rectosigmoid diverticulosis without acute inflammation. Aortic Atherosclerosis (ICD10-I70.0). Electronically Signed   By: Constance Holster M.D.   On: 09/22/2019 19:15     Assessment/Plan Principal Problem:   Acute GI bleeding Active Problems:   Transient confusion   Acute blood loss anemia    1. Acute GI bleed suspect likely from lower GI from the diverticulosis as seen in the CAT scan for which we will keep patient n.p.o. check serial CBC gastroenterology will be seeing patient in consult.  Holding of Plavix for now since patient has acute GI bleed patient agreeable. 2. History of TIA with had brief episodes of confusion and difficulty bringing out words denied an MRI brain was negative.  Holding Plavix due to acute GI bleed. 3. Acute blood loss anemia follow CBC.  Given the patient's hypotensive episode on presentation with recurrent GI bleed will need close monitoring and inpatient status.   DVT prophylaxis: SCDs. Code Status: Full code as  confirmed with patient. Family Communication: Patient. Disposition Plan: Home. Consults called: Gastroenterology. Admission status: Inpatient.   Rise Patience MD [facility] Pager [phone].  If 7PM-7AM, please contact night-coverage [email or web address] Password [email or web address]  09/23/2019, 12:13 AM

## 2019-09-23 NOTE — Consult Note (Signed)
Referring Provider: Dr. Sloan Leiter Primary Care Physician:  Orpah Melter, MD Primary Gastroenterologist:  Dr. Amedeo Plenty  Reason for Consultation:  GI bleed  HPI: Jill Pitts is a 86 y.o. female is seen for a consult due to the acute onset of red blood per rectum in the setting of Plavix for a TIA. Reports 2 episodes red blood per rectum with minimal stool and 2nd episode felt like blood was explosive. Has been having left-sided abdominal pain but she thinks it is related to her chronic left side pain and back pain. Denies black stools or diarrhea. History of constipation where she has a BM 2-3 times per week. Last colonoscopy in 2006 that showed diffuse diverticulosis. 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 She reports episodes of diverticulitis in the past but denies ever having known diverticular bleeding. Hgb 10.6 on admit yesterday and now 7.4.  Past Medical History:  Diagnosis Date  . Degenerative disc disease, cervical   . Diverticulitis   . Eczema   . Headache 10/25/2014  . Hyperlipidemia   . Osteoarthritis   . Osteoporosis   . Seborrheic dermatitis   . Sinusitis   . Subjective visual disturbance 10/25/2014  . TIA (transient ischemic attack)   . Tinnitus     Past Surgical History:  Procedure Laterality Date  . ABDOMINAL HYSTERECTOMY    . APPENDECTOMY  1981  . CARPAL TUNNEL RELEASE Right 1998  . CATARACT EXTRACTION Bilateral   . NASAL POLYP SURGERY    . TONSILLECTOMY AND ADENOIDECTOMY      Prior to Admission medications   Medication Sig Start Date End Date Taking? Authorizing Provider  Biotin 1 MG CAPS Take 10,000 mg by mouth daily.   Yes [provider]  Calcium Carb-Cholecalciferol (CALCIUM + D3) 600-200 MG-UNIT TABS Take 1 tablet by mouth 2 (two) times daily.   Yes [provider]  clopidogrel (PLAVIX) 75 MG tablet Take 75 mg by mouth daily.   Yes [provider]  Co-Enzyme Q-10 30  MG CAPS Take 30 mg by mouth.   Yes [provider]  Fish Oil OIL Take 1,400 mg by mouth 2 (two) times daily.   Yes [provider]  lovastatin (MEVACOR) 20 MG tablet Take 20 mg by mouth daily. 08/28/14  Yes [provider]  Multiple Vitamins-Minerals (MULTIVITAMIN ADULT PO) Take 1 tablet by mouth daily.   Yes [provider]    Scheduled Meds: . sodium chloride   Intravenous Once   Continuous Infusions: . sodium chloride 75 mL/hr at 09/23/19 0040   PRN Meds:.acetaminophen **OR** acetaminophen, ondansetron **OR** ondansetron (ZOFRAN) IV  Allergies as of 09/22/2019 - Review Complete 09/22/2019  Allergen Reaction Noted  . Gabapentin  04/21/2016    Family History  Problem Relation Age of Onset  . Heart disease Father   . Prostate cancer Father   . Asthma Sister   . Hypertension Sister   . Diabetes Sister   . Stroke Brother     Social History   Socioeconomic History  . Marital status: Widowed    Spouse name: Not on file  . Number of children: 1  . Years of education: College  . Highest education level: Not on file  Occupational History  . Occupation: Retired  Tobacco Use  . Smoking status: Former Research scientist (life sciences)  . Smokeless tobacco: Never Used  Substance and Sexual Activity  . Alcohol use: Yes    Alcohol/week: 0.0 standard drinks  Comment: very rare 1-2 glasses wine per year  . Drug use: No  . Sexual activity: Not on file  Other Topics Concern  . Not on file  Social History Narrative   Patient is right handed.   Patient drinks 1 cup of caffeine per day.   Patient lives at home with son and family.   Social Determinants of Health   Financial Resource Strain:   . Difficulty of Paying Living Expenses: Not on file  Food Insecurity:   . Worried About Charity fundraiser in the Last Year: Not on file  . Ran Out of Food in the Last Year: Not on file  Transportation Needs:   . Lack of Transportation (Medical): Not on file  . Lack of  Transportation (Non-Medical): Not on file  Physical Activity:   . Days of Exercise per Week: Not on file  . Minutes of Exercise per Session: Not on file  Stress:   . Feeling of Stress : Not on file  Social Connections:   . Frequency of Communication with Friends and Family: Not on file  . Frequency of Social Gatherings with Friends and Family: Not on file  . Attends Religious Services: Not on file  . Active Member of Clubs or Organizations: Not on file  . Attends Archivist Meetings: Not on file  . Marital Status: Not on file  Intimate Partner Violence:   . Fear of Current or Ex-Partner: Not on file  . Emotionally Abused: Not on file  . Physically Abused: Not on file  . Sexually Abused: Not on file    Review of Systems: All negative except as stated above in HPI.  Physical Exam: Vital signs: Vitals:   09/23/19 0826 09/23/19 1224  BP: 130/63 127/60  Pulse: 80 79  Resp: 18 18  Temp: 98.3 F (36.8 C) 97.9 F (36.6 C)  SpO2: 97% 97%   Last BM Date: 09/22/19 General:   Lethargic, thin, pleasant, no acute distress  Head: normocephalic, atraumatic Eyes: anicteric sclera ENT: oropharynx clear Neck: supple, nontender Lungs:  Clear throughout to auscultation.   No wheezes, crackles, or rhonchi. No acute distress. Heart:  Regular rate and rhythm; no murmurs, clicks, rubs,  or gallops. Abdomen: LLQ and left mid-quadrant tenderness with guarding, soft, nondistended, +BS  Rectal:  Deferred Ext: no edema  GI:  Lab Results: Recent Labs    09/23/19 0104 09/23/19 0402 09/23/19 1027  WBC 8.7 7.7 6.1  HGB 8.1* 7.6* 7.4*  HCT 24.8* 23.7* 23.1*  PLT 179 184 167   BMET Recent Labs    09/22/19 1532 09/23/19 0402  NA 142 143  K 4.0 3.7  CL 108 112*  CO2 22 25  GLUCOSE 139* 105*  BUN 23 14  CREATININE 0.82 0.62  CALCIUM 8.7* 8.0*   LFT Recent Labs    09/22/19 1532  PROT 5.5*  ALBUMIN 3.2*  AST 21  ALT 14  ALKPHOS 33*  BILITOT 0.5   PT/INR Recent  Labs    09/22/19 1543  LABPROT 12.9  INR 1.0     Studies/Results: CT Head Wo Contrast  Result Date: 09/22/2019 CLINICAL DATA:  86 year old female with fall.  Concern for TIA. EXAM: CT HEAD WITHOUT CONTRAST TECHNIQUE: Contiguous axial images were obtained from the base of the skull through the vertex without intravenous contrast. COMPARISON:  None FINDINGS: Brain: Moderate age-related atrophy and chronic microvascular ischemic changes. There is no acute intracranial hemorrhage. No mass effect or midline shift no extra-axial fluid  collection. Vascular: No hyperdense vessel or unexpected calcification. Skull: Normal. Negative for fracture or focal lesion. Sinuses/Orbits: There is complete opacification of the right frontal sinus and multiple ethmoid air cells. No air-fluid level. Small bilateral maxillary sinus retention cysts or polyps noted. The mastoid air cells are clear. Other: None IMPRESSION: 1. No acute intracranial hemorrhage. 2. Age-related atrophy and chronic microvascular ischemic changes. Electronically Signed   By: Anner Crete M.D.   On: 09/22/2019 19:36   MR BRAIN WO CONTRAST  Result Date: 09/22/2019 CLINICAL DATA:  GI bleeding. Anticoagulated. Focal neurological deficit. Fell. EXAM: MRI HEAD WITHOUT CONTRAST TECHNIQUE: Multiplanar, multiecho pulse sequences of the brain and surrounding structures were obtained without intravenous contrast. COMPARISON:  Head CT same day.  MRI 11/19/2014. FINDINGS: Brain: Diffusion imaging does not show any acute or subacute infarction. No focal finding affects the brainstem or cerebellum. Cerebral hemispheres show pronounced confluent changes of small vessel disease throughout the deep and subcortical white matter. There is age related generalized atrophy. No sign of large vessel infarction. No mass lesion, hemorrhage, hydrocephalus or extra-axial collection. Vascular: Major vessels at the base of the brain show flow. Skull and upper cervical spine:  Negative Sinuses/Orbits: Opacification of the right frontal and anterior ethmoid sinuses. Other paranasal sinuses are clear. Orbits are negative. Previous lens implants. Other: Question nasal polyp on the left versus inflamed or engorged turbinates. IMPRESSION: No acute intracranial finding. Age related atrophy. Advanced chronic small-vessel ischemic changes affecting the cerebral hemispheric white matter. Opacified right frontal and anterior ethmoid sinuses. Question left nasal polyp versus engorged turbinate. Electronically Signed   By: Nelson Chimes M.D.   On: 09/22/2019 22:48   CT ABDOMEN PELVIS W CONTRAST  Result Date: 09/22/2019 CLINICAL DATA:  Diverticulitis. EXAM: CT ABDOMEN AND PELVIS WITH CONTRAST TECHNIQUE: Multidetector CT imaging of the abdomen and pelvis was performed using the standard protocol following bolus administration of intravenous contrast. CONTRAST:  85mL OMNIPAQUE IOHEXOL 300 MG/ML  SOLN COMPARISON:  None. FINDINGS: Lower chest: The lung bases are clear. The heart size is normal. Hepatobiliary: The liver is normal. Normal gallbladder.There is no biliary ductal dilation. Pancreas: Normal contours without ductal dilatation. No peripancreatic fluid collection. Spleen: No splenic laceration or hematoma. Adrenals/Urinary Tract: --Adrenal glands: No adrenal hemorrhage. --Right kidney/ureter: No hydronephrosis or perinephric hematoma. --Left kidney/ureter: No hydronephrosis or perinephric hematoma. --Urinary bladder: Unremarkable. Stomach/Bowel: --Stomach/Duodenum: No hiatal hernia or other gastric abnormality. Normal duodenal course and caliber. --Small bowel: No dilatation or inflammation. --Colon: Rectosigmoid diverticulosis without acute inflammation. --Appendix: Normal. Vascular/Lymphatic: Atherosclerotic calcification is present within the non-aneurysmal abdominal aorta, without hemodynamically significant stenosis. --No retroperitoneal lymphadenopathy. --No mesenteric lymphadenopathy.  --No pelvic or inguinal lymphadenopathy. Reproductive: Status post hysterectomy. No adnexal mass. Other: No ascites or free air. There is a small fat containing umbilical hernia. Musculoskeletal. There is a prominent Schmorl's node involving the superior endplate of the L4 vertebral body. Multilevel degenerative changes are noted throughout the visualized thoracolumbar spine without evidence for definite acute displaced fracture. IMPRESSION: Rectosigmoid diverticulosis without acute inflammation. Aortic Atherosclerosis (ICD10-I70.0). Electronically Signed   By: Constance Holster M.D.   On: 09/22/2019 19:15    Impression/Plan: Rectal bleeding with left-sided abdominal pain. CT negative for colonic inflammation. Suspect her bleeding is due to a diverticular source and would manage conservatively. Ischemic colitis possible but less likely with negative CT. No bleeding since admit. Plavix on hold. D/W son who is concerned about her going home and rebleeding and would prefer a colonoscopy be done. Will follow her  H/Hs closely following her blood transfusions and if anemia persists or bleeding recurs, then will plan to do a colonoscopy but otherwise I would favor conservative management and not pursue one in her. Clear liquid diet. Will follow.    LOS: 1 day   [name]  09/23/2019, 12:50 PM  Questions please call [phone]

## 2019-09-24 LAB — CBC
HCT: 28.4 % — ABNORMAL LOW (ref 36.0–46.0)
Hemoglobin: 9.4 g/dL — ABNORMAL LOW (ref 12.0–15.0)
MCH: 29.7 pg (ref 26.0–34.0)
MCHC: 33.1 g/dL (ref 30.0–36.0)
MCV: 89.6 fL (ref 80.0–100.0)
Platelets: 171 10*3/uL (ref 150–400)
RBC: 3.17 MIL/uL — ABNORMAL LOW (ref 3.87–5.11)
RDW: 12.7 % (ref 11.5–15.5)
WBC: 6.9 10*3/uL (ref 4.0–10.5)
nRBC: 0 % (ref 0.0–0.2)

## 2019-09-24 LAB — TYPE AND SCREEN
ABO/RH(D): A POS
Antibody Screen: NEGATIVE
Unit division: 0

## 2019-09-24 LAB — BPAM RBC
Blood Product Expiration Date: 202103022359
ISSUE DATE / TIME: 202102061231
Unit Type and Rh: 6200

## 2019-09-24 LAB — HEMOGLOBIN AND HEMATOCRIT, BLOOD
HCT: 30.5 % — ABNORMAL LOW (ref 36.0–46.0)
Hemoglobin: 10.3 g/dL — ABNORMAL LOW (ref 12.0–15.0)

## 2019-09-24 MED ORDER — SODIUM CHLORIDE 0.9 % IV SOLN: INTRAVENOUS | Status: AC

## 2019-09-24 MED ORDER — SODIUM CHLORIDE 0.9 % IV SOLN: INTRAVENOUS | Status: DC

## 2019-09-24 MED ORDER — PEG 3350-KCL-NA BICARB-NACL 420 G PO SOLR
4000.0000 mL | Freq: Once | ORAL | Status: AC
  Administered 2019-09-24: 16:00:00 4000 mL via ORAL
  Filled 2019-09-24: qty 4000

## 2019-09-24 NOTE — Plan of Care (Signed)
Poc progressing.  

## 2019-09-24 NOTE — Progress Notes (Signed)
[facility] Gastroenterology Progress Note  Jill Pitts 86 y.o. 12-Mar-1929   Subjective: Bloody stool yesterday. Denies abdominal pain. Sitting in chair. Son and nurse in room.  Objective: Vital signs: Vitals:   09/23/19 1900 09/24/19 0449  BP: (!) 129/49 (!) 121/56  Pulse: 68 65  Resp: 15 18  Temp: 98 F (36.7 C) 98.3 F (36.8 C)  SpO2: 98% 95%    Physical Exam: Gen: alert, no acute distress, thin, elderly, pleasant HEENT: anicteric sclera CV: RRR Chest: CTA B Abd: left-sided tenderness with guarding, soft, nondistended, +BS Ext: no edema  Lab Results: Recent Labs    09/22/19 1532 09/23/19 0402  NA 142 143  K 4.0 3.7  CL 108 112*  CO2 22 25  GLUCOSE 139* 105*  BUN 23 14  CREATININE 0.82 0.62  CALCIUM 8.7* 8.0*   Recent Labs    09/22/19 1532  AST 21  ALT 14  ALKPHOS 33*  BILITOT 0.5  PROT 5.5*  ALBUMIN 3.2*   Recent Labs    09/22/19 1532 09/23/19 0104 09/23/19 2239 09/24/19 0446  WBC 14.8*   < > 7.3 6.9  NEUTROABS 13.9*  --   --   --   HGB 10.6*   < > 9.7* 9.4*  HCT 34.0*   < > 29.3* 28.4*  MCV 92.6   < > 89.6 89.6  PLT 245   < > 180 171   < > = values in this interval not displayed.      Assessment/Plan: GI bleed - suspect diverticular but due to recurrence yesterday and need for Plavix at discharge will do an updated colonoscopy tomorrow. Colon prep today. Clear liquid diet. NPO p MN.   Jill Pitts 09/24/2019, 11:35 AM  Questions please call [phone] ID: Jill Pitts, female   DOB: 09/28/1928, 86 y.o.   MRN: [id number]

## 2019-09-24 NOTE — Progress Notes (Addendum)
PROGRESS NOTE    Jill Pitts  [id number] DOB: 08/25/1928 DOA: 09/22/2019 PCP: Orpah Melter, MD    Brief Narrative:  86 year old female, functional and lives at home with history of TIA on Plavix and statins, history of diverticulosis presented to the emergency room with 2 large bloody bowel movements and dizziness and confusion. In the emergency room, hemodynamically stable.  Hemoglobin 10.6.  CT abdomen unremarkable.  CT head and MRI brain normal.   Assessment & Plan:   Principal Problem:   Acute GI bleeding Active Problems:   Transient confusion   Acute blood loss anemia  Acute lower GI bleeding with acute blood loss anemia, symptomatic anemia: Suspected diverticular bleed.  Clinically improving.  Anticipate conservative management.  Followed by GI. Hemoglobin dropped from 10.6-8-7.6-7.4, 1 unit of PRBC-responded appropriately and 9.4 today. Monitor hemoglobin every 12 hours. She had 1 bowel movement last 24 hours with frank blood on it. Continue maintenance IV fluids.  Plavix on hold.  On clear liquid diet.  History of TIA with brief episode of confusion: CT and MRI brain normal.  Holding Plavix until good clinical improvement.  If stop further bleeding, may resume in 2 weeks.  Advance activities.  Ambulate in the hallway.  Check orthostatic. Check hemoglobin every 12 hours to ensure stability.   DVT prophylaxis: SCDs Code Status: Full code Family Communication: Patient's son on the phone.  Discussed about anticipated conservative management and ongoing monitoring in the hospital today. Disposition Plan: patient is from home. Anticipated DC to home, Barriers to discharge active GI bleeding.   Consultants:   Gastroenterology  Procedures:   None  Antimicrobials:   None   Subjective: Patient seen and examined.  No overnight events.  She did not get good night sleep so she does not feel well.  Denies any nausea vomiting or abdominal pain.  Patient reported  one bowel movement with frank blood on it.  Patient's son witnessed it. Objective: Vitals:   09/23/19 1311 09/23/19 1651 09/23/19 1900 09/24/19 0449  BP: 120/62 (!) 145/66 (!) 129/49 (!) 121/56  Pulse: 73 67 68 65  Resp: 16 15 15 18   Temp: 98.2 F (36.8 C) 98.2 F (36.8 C) 98 F (36.7 C) 98.3 F (36.8 C)  TempSrc: Oral Oral Oral Oral  SpO2: 96% 96% 98% 95%  Weight:        Intake/Output Summary (Last 24 hours) at 09/24/2019 0936 Last data filed at 09/23/2019 2300 Gross per 24 hour  Intake 340 ml  Output --  Net 340 ml   Filed Weights   09/22/19 2345  Weight: 43 kg    Examination:  General exam: Appears calm and comfortable  Respiratory system: Clear to auscultation. Respiratory effort normal. Cardiovascular system: S1 & S2 heard, RRR. No JVD, murmurs, rubs, gallops or clicks. No pedal edema. Gastrointestinal system: Abdomen is nondistended, soft and nontender. No organomegaly or masses felt. Normal bowel sounds heard. Central nervous system: Alert and oriented. No focal neurological deficits. Extremities: Symmetric 5 x 5 power. Skin: No rashes, lesions or ulcers Psychiatry: Judgement and insight appear normal. Mood & affect appropriate.     Data Reviewed: I have personally reviewed following labs and imaging studies  CBC: Recent Labs  Lab 09/22/19 1532 09/23/19 0104 09/23/19 0402 09/23/19 1027 09/23/19 1850 09/23/19 2239 09/24/19 0446  WBC 14.8*   < > 7.7 6.1 6.9 7.3 6.9  NEUTROABS 13.9*  --   --   --   --   --   --  HGB 10.6*   < > 7.6* 7.4* 10.4* 9.7* 9.4*  HCT 34.0*   < > 23.7* 23.1* 32.0* 29.3* 28.4*  MCV 92.6   < > 90.1 90.6 90.9 89.6 89.6  PLT 245   < > 184 167 179 180 171   < > = values in this interval not displayed.   Basic Metabolic Panel: Recent Labs  Lab 09/22/19 1532 09/23/19 0402  NA 142 143  K 4.0 3.7  CL 108 112*  CO2 22 25  GLUCOSE 139* 105*  BUN 23 14  CREATININE 0.82 0.62  CALCIUM 8.7* 8.0*   GFR: CrCl cannot be calculated  (Unknown ideal weight.). Liver Function Tests: Recent Labs  Lab 09/22/19 1532  AST 21  ALT 14  ALKPHOS 33*  BILITOT 0.5  PROT 5.5*  ALBUMIN 3.2*   Recent Labs  Lab 09/22/19 1532  LIPASE 39   No results for input(s): AMMONIA in the last 168 hours. Coagulation Profile: Recent Labs  Lab 09/22/19 1543  INR 1.0   Cardiac Enzymes: No results for input(s): CKTOTAL, CKMB, CKMBINDEX, TROPONINI in the last 168 hours. BNP (last 3 results) No results for input(s): PROBNP in the last 8760 hours. HbA1C: No results for input(s): HGBA1C in the last 72 hours. CBG: No results for input(s): GLUCAP in the last 168 hours. Lipid Profile: No results for input(s): CHOL, HDL, LDLCALC, TRIG, CHOLHDL, LDLDIRECT in the last 72 hours. Thyroid Function Tests: No results for input(s): TSH, T4TOTAL, FREET4, T3FREE, THYROIDAB in the last 72 hours. Anemia Panel: No results for input(s): VITAMINB12, FOLATE, FERRITIN, TIBC, IRON, RETICCTPCT in the last 72 hours. Sepsis Labs: No results for input(s): PROCALCITON, LATICACIDVEN in the last 168 hours.  Recent Results (from the past 240 hour(s))  Respiratory Panel by RT PCR (Flu A&B, Covid) -     Status: None   Collection Time: 09/22/19  9:26 PM  Result Value Ref Range Status   SARS Coronavirus 2 by RT PCR NEGATIVE NEGATIVE Final    Comment: (NOTE) SARS-CoV-2 target nucleic acids are NOT DETECTED. The SARS-CoV-2 RNA is generally detectable in upper respiratoy specimens during the acute phase of infection. The lowest concentration of SARS-CoV-2 viral copies this assay can detect is 131 copies/mL. A negative result does not preclude SARS-Cov-2 infection and should not be used as the sole basis for treatment or other patient management decisions. A negative result may occur with  improper specimen collection/handling, submission of specimen other than nasopharyngeal swab, presence of viral mutation(s) within the areas targeted by this assay, and  inadequate number of viral copies (<131 copies/mL). A negative result must be combined with clinical observations, patient history, and epidemiological information. The expected result is Negative. Fact Sheet for Patients:  PinkCheek.be Fact Sheet for Healthcare Providers:  GravelBags.it This test is not yet ap proved or cleared by the [place] FDA and  has been authorized for detection and/or diagnosis of SARS-CoV-2 by FDA under an Emergency Use Authorization (EUA). This EUA will remain  in effect (meaning this test can be used) for the duration of the COVID-19 declaration under Section 564(b)(1) of the Act, 21 U.S.C. section 360bbb-3(b)(1), unless the authorization is terminated or revoked sooner.    Influenza A by PCR NEGATIVE NEGATIVE Final   Influenza B by PCR NEGATIVE NEGATIVE Final    Comment: (NOTE) The Xpert Xpress SARS-CoV-2/FLU/RSV assay is intended as an aid in  the diagnosis of influenza from Nasopharyngeal swab specimens and  should not be used as a sole  basis for treatment. Nasal washings and  aspirates are unacceptable for Xpert Xpress SARS-CoV-2/FLU/RSV  testing. Fact Sheet for Patients: PinkCheek.be Fact Sheet for Healthcare Providers: GravelBags.it This test is not yet approved or cleared by the [place] FDA and  has been authorized for detection and/or diagnosis of SARS-CoV-2 by  FDA under an Emergency Use Authorization (EUA). This EUA will remain  in effect (meaning this test can be used) for the duration of the  Covid-19 declaration under Section 564(b)(1) of the Act, 21  U.S.C. section 360bbb-3(b)(1), unless the authorization is  terminated or revoked. Performed at [facility], [place] [address], [place], [place] [address]          Radiology Studies: CT Head Wo Contrast  Result Date: 09/22/2019 CLINICAL DATA:  86 year old  female with fall.  Concern for TIA. EXAM: CT HEAD WITHOUT CONTRAST TECHNIQUE: Contiguous axial images were obtained from the base of the skull through the vertex without intravenous contrast. COMPARISON:  None FINDINGS: Brain: Moderate age-related atrophy and chronic microvascular ischemic changes. There is no acute intracranial hemorrhage. No mass effect or midline shift no extra-axial fluid collection. Vascular: No hyperdense vessel or unexpected calcification. Skull: Normal. Negative for fracture or focal lesion. Sinuses/Orbits: There is complete opacification of the right frontal sinus and multiple ethmoid air cells. No air-fluid level. Small bilateral maxillary sinus retention cysts or polyps noted. The mastoid air cells are clear. Other: None IMPRESSION: 1. No acute intracranial hemorrhage. 2. Age-related atrophy and chronic microvascular ischemic changes. Electronically Signed   By: Anner Crete M.D.   On: 09/22/2019 19:36   MR BRAIN WO CONTRAST  Result Date: 09/22/2019 CLINICAL DATA:  GI bleeding. Anticoagulated. Focal neurological deficit. Fell. EXAM: MRI HEAD WITHOUT CONTRAST TECHNIQUE: Multiplanar, multiecho pulse sequences of the brain and surrounding structures were obtained without intravenous contrast. COMPARISON:  Head CT same day.  MRI 11/19/2014. FINDINGS: Brain: Diffusion imaging does not show any acute or subacute infarction. No focal finding affects the brainstem or cerebellum. Cerebral hemispheres show pronounced confluent changes of small vessel disease throughout the deep and subcortical white matter. There is age related generalized atrophy. No sign of large vessel infarction. No mass lesion, hemorrhage, hydrocephalus or extra-axial collection. Vascular: Major vessels at the base of the brain show flow. Skull and upper cervical spine: Negative Sinuses/Orbits: Opacification of the right frontal and anterior ethmoid sinuses. Other paranasal sinuses are clear. Orbits are negative.  Previous lens implants. Other: Question nasal polyp on the left versus inflamed or engorged turbinates. IMPRESSION: No acute intracranial finding. Age related atrophy. Advanced chronic small-vessel ischemic changes affecting the cerebral hemispheric white matter. Opacified right frontal and anterior ethmoid sinuses. Question left nasal polyp versus engorged turbinate. Electronically Signed   By: Nelson Chimes M.D.   On: 09/22/2019 22:48   CT ABDOMEN PELVIS W CONTRAST  Result Date: 09/22/2019 CLINICAL DATA:  Diverticulitis. EXAM: CT ABDOMEN AND PELVIS WITH CONTRAST TECHNIQUE: Multidetector CT imaging of the abdomen and pelvis was performed using the standard protocol following bolus administration of intravenous contrast. CONTRAST:  2mL OMNIPAQUE IOHEXOL 300 MG/ML  SOLN COMPARISON:  None. FINDINGS: Lower chest: The lung bases are clear. The heart size is normal. Hepatobiliary: The liver is normal. Normal gallbladder.There is no biliary ductal dilation. Pancreas: Normal contours without ductal dilatation. No peripancreatic fluid collection. Spleen: No splenic laceration or hematoma. Adrenals/Urinary Tract: --Adrenal glands: No adrenal hemorrhage. --Right kidney/ureter: No hydronephrosis or perinephric hematoma. --Left kidney/ureter: No hydronephrosis or perinephric hematoma. --Urinary bladder: Unremarkable. Stomach/Bowel: --  Stomach/Duodenum: No hiatal hernia or other gastric abnormality. Normal duodenal course and caliber. --Small bowel: No dilatation or inflammation. --Colon: Rectosigmoid diverticulosis without acute inflammation. --Appendix: Normal. Vascular/Lymphatic: Atherosclerotic calcification is present within the non-aneurysmal abdominal aorta, without hemodynamically significant stenosis. --No retroperitoneal lymphadenopathy. --No mesenteric lymphadenopathy. --No pelvic or inguinal lymphadenopathy. Reproductive: Status post hysterectomy. No adnexal mass. Other: No ascites or free air. There is a small fat  containing umbilical hernia. Musculoskeletal. There is a prominent Schmorl's node involving the superior endplate of the L4 vertebral body. Multilevel degenerative changes are noted throughout the visualized thoracolumbar spine without evidence for definite acute displaced fracture. IMPRESSION: Rectosigmoid diverticulosis without acute inflammation. Aortic Atherosclerosis (ICD10-I70.0). Electronically Signed   By: Constance Holster M.D.   On: 09/22/2019 19:15        Scheduled Meds: Continuous Infusions:   LOS: 2 days    Time spent: 30 minutes    Barb Merino, MD [facility] Pager [phone]

## 2019-09-25 ENCOUNTER — Encounter (HOSPITAL_COMMUNITY): Payer: Self-pay | Admitting: Internal Medicine

## 2019-09-25 ENCOUNTER — Encounter (HOSPITAL_COMMUNITY)
Admission: EM | Disposition: A | Discharge status: Home / Self Care | Payer: Self-pay | Source: Home / Self Care | Attending: Internal Medicine

## 2019-09-25 ENCOUNTER — Inpatient Hospital Stay (HOSPITAL_COMMUNITY): Payer: Medicare HMO | Admitting: Anesthesiology

## 2019-09-25 HISTORY — PX: COLONOSCOPY WITH PROPOFOL: SHX5780

## 2019-09-25 HISTORY — PX: BIOPSY: SHX5522

## 2019-09-25 LAB — HEMOGLOBIN AND HEMATOCRIT, BLOOD
HCT: 31.6 % — ABNORMAL LOW (ref 36.0–46.0)
Hemoglobin: 10.2 g/dL — ABNORMAL LOW (ref 12.0–15.0)

## 2019-09-25 SURGERY — COLONOSCOPY WITH PROPOFOL
Anesthesia: Monitor Anesthesia Care

## 2019-09-25 MED ORDER — LACTATED RINGERS IV SOLN: INTRAVENOUS | Status: DC | PRN

## 2019-09-25 MED ORDER — LIDOCAINE HCL (CARDIAC) PF 100 MG/5ML IV SOSY
PREFILLED_SYRINGE | INTRAVENOUS | Status: DC | PRN
  Administered 2019-09-25: 60 mg via INTRATRACHEAL

## 2019-09-25 MED ORDER — PROPOFOL 10 MG/ML IV BOLUS
INTRAVENOUS | Status: DC | PRN
  Administered 2019-09-25: 20 mg via INTRAVENOUS

## 2019-09-25 MED ORDER — PROPOFOL 500 MG/50ML IV EMUL
INTRAVENOUS | Status: DC | PRN
  Administered 2019-09-25: 75 ug/kg/min via INTRAVENOUS

## 2019-09-25 SURGICAL SUPPLY — 21 items

## 2019-09-25 NOTE — Progress Notes (Signed)
PROGRESS NOTE    Jill Pitts  [id number] DOB: Jul 24, 1929 DOA: 09/22/2019 PCP: Orpah Melter, MD    Brief Narrative:  86 year old female, functional and lives at home with history of TIA on Plavix and statins, history of diverticulosis presented to the emergency room with 2 large bloody bowel movements and dizziness and confusion. In the emergency room, hemodynamically stable.  Hemoglobin 10.6.  CT abdomen unremarkable.  CT head and MRI brain normal.   Assessment & Plan:   Principal Problem:   Acute GI bleeding Active Problems:   Transient confusion   Acute blood loss anemia  Acute lower GI bleeding with acute blood loss anemia, symptomatic anemia: Diverticular bleed present. Status post colonoscopy today, found to have diffuse diverticulosis with no active bleeding.   Clinically improving after initial bleeding.   Hemoglobin dropped from 10.6-8-7.6-7.4, 1 unit of PRBC-responded appropriately and 9.4 today. Monitor hemoglobin tomorrow morning. With clinical improvement, advance diet today, continue to monitor with risk of hemorrhage.  History of TIA with brief episode of confusion: CT and MRI brain normal.  Holding Plavix until good clinical improvement.    Advance activities.  Ambulate in the hallway.  Check orthostatic.  DVT prophylaxis: SCDs Code Status: Full code Family Communication: None today.   Disposition Plan: patient is from home. Anticipated DC to home, Barriers to discharge active GI bleeding needs to be monitored in the hospital.   Consultants:   Gastroenterology  Procedures:  Colonoscopy, polyps and diffuse diverticulosis.  No active bleeding. Antimicrobials:   None   Subjective: Patient seen and examined.  No overnight events.  Only thing that bothered her today was 3 AM blood draw.  No more bleeding from rectum. She was seen when she was getting ready to colonoscopy.  Objective: Vitals:   09/25/19 1045 09/25/19 1055 09/25/19 1105 09/25/19 1115   BP: 135/66 (!) 147/59 (!) 156/56 (!) 165/58  Pulse: 66 72 71 69  Resp: (!) 21 (!) 21 15 15   Temp: 97.7 F (36.5 C)     TempSrc: Axillary     SpO2: 100% 96% 97% 97%  Weight:        Intake/Output Summary (Last 24 hours) at 09/25/2019 1218 Last data filed at 09/25/2019 1038 Gross per 24 hour  Intake 1346.34 ml  Output --  Net 1346.34 ml   Filed Weights   09/22/19 2345  Weight: 43 kg    Examination:  General exam: Appears calm and comfortable  Respiratory system: Clear to auscultation. Respiratory effort normal. Cardiovascular system: S1 & S2 heard, RRR. No JVD, murmurs, rubs, gallops or clicks. No pedal edema. Gastrointestinal system: Abdomen is nondistended, soft and nontender. No organomegaly or masses felt. Normal bowel sounds heard. Central nervous system: Alert and oriented. No focal neurological deficits. Extremities: Symmetric 5 x 5 power. Skin: No rashes, lesions or ulcers Psychiatry: Judgement and insight appear normal. Mood & affect appropriate.     Data Reviewed: I have personally reviewed following labs and imaging studies  CBC: Recent Labs  Lab 09/22/19 1532 09/23/19 0104 09/23/19 0402 09/23/19 0402 09/23/19 1027 09/23/19 1027 09/23/19 1850 09/23/19 2239 09/24/19 0446 09/24/19 1602 09/25/19 0330  WBC 14.8*   < > 7.7  --  6.1  --  6.9 7.3 6.9  --   --   NEUTROABS 13.9*  --   --   --   --   --   --   --   --   --   --   HGB 10.6*   < >  7.6*   < > 7.4*   < > 10.4* 9.7* 9.4* 10.3* 10.2*  HCT 34.0*   < > 23.7*   < > 23.1*   < > 32.0* 29.3* 28.4* 30.5* 31.6*  MCV 92.6   < > 90.1  --  90.6  --  90.9 89.6 89.6  --   --   PLT 245   < > 184  --  167  --  179 180 171  --   --    < > = values in this interval not displayed.   Basic Metabolic Panel: Recent Labs  Lab 09/22/19 1532 09/23/19 0402  NA 142 143  K 4.0 3.7  CL 108 112*  CO2 22 25  GLUCOSE 139* 105*  BUN 23 14  CREATININE 0.82 0.62  CALCIUM 8.7* 8.0*   GFR: CrCl cannot be calculated  (Unknown ideal weight.). Liver Function Tests: Recent Labs  Lab 09/22/19 1532  AST 21  ALT 14  ALKPHOS 33*  BILITOT 0.5  PROT 5.5*  ALBUMIN 3.2*   Recent Labs  Lab 09/22/19 1532  LIPASE 39   No results for input(s): AMMONIA in the last 168 hours. Coagulation Profile: Recent Labs  Lab 09/22/19 1543  INR 1.0   Cardiac Enzymes: No results for input(s): CKTOTAL, CKMB, CKMBINDEX, TROPONINI in the last 168 hours. BNP (last 3 results) No results for input(s): PROBNP in the last 8760 hours. HbA1C: No results for input(s): HGBA1C in the last 72 hours. CBG: No results for input(s): GLUCAP in the last 168 hours. Lipid Profile: No results for input(s): CHOL, HDL, LDLCALC, TRIG, CHOLHDL, LDLDIRECT in the last 72 hours. Thyroid Function Tests: No results for input(s): TSH, T4TOTAL, FREET4, T3FREE, THYROIDAB in the last 72 hours. Anemia Panel: No results for input(s): VITAMINB12, FOLATE, FERRITIN, TIBC, IRON, RETICCTPCT in the last 72 hours. Sepsis Labs: No results for input(s): PROCALCITON, LATICACIDVEN in the last 168 hours.  Recent Results (from the past 240 hour(s))  Respiratory Panel by RT PCR (Flu A&B, Covid) -     Status: None   Collection Time: 09/22/19  9:26 PM  Result Value Ref Range Status   SARS Coronavirus 2 by RT PCR NEGATIVE NEGATIVE Final    Comment: (NOTE) SARS-CoV-2 target nucleic acids are NOT DETECTED. The SARS-CoV-2 RNA is generally detectable in upper respiratoy specimens during the acute phase of infection. The lowest concentration of SARS-CoV-2 viral copies this assay can detect is 131 copies/mL. A negative result does not preclude SARS-Cov-2 infection and should not be used as the sole basis for treatment or other patient management decisions. A negative result may occur with  improper specimen collection/handling, submission of specimen other than nasopharyngeal swab, presence of viral mutation(s) within the areas targeted by this assay, and  inadequate number of viral copies (<131 copies/mL). A negative result must be combined with clinical observations, patient history, and epidemiological information. The expected result is Negative. Fact Sheet for Patients:  PinkCheek.be Fact Sheet for Healthcare Providers:  GravelBags.it This test is not yet ap proved or cleared by the [place] FDA and  has been authorized for detection and/or diagnosis of SARS-CoV-2 by FDA under an Emergency Use Authorization (EUA). This EUA will remain  in effect (meaning this test can be used) for the duration of the COVID-19 declaration under Section 564(b)(1) of the Act, 21 U.S.C. section 360bbb-3(b)(1), unless the authorization is terminated or revoked sooner.    Influenza A by PCR NEGATIVE NEGATIVE Final   Influenza B by  PCR NEGATIVE NEGATIVE Final    Comment: (NOTE) The Xpert Xpress SARS-CoV-2/FLU/RSV assay is intended as an aid in  the diagnosis of influenza from Nasopharyngeal swab specimens and  should not be used as a sole basis for treatment. Nasal washings and  aspirates are unacceptable for Xpert Xpress SARS-CoV-2/FLU/RSV  testing. Fact Sheet for Patients: PinkCheek.be Fact Sheet for Healthcare Providers: GravelBags.it This test is not yet approved or cleared by the [place] FDA and  has been authorized for detection and/or diagnosis of SARS-CoV-2 by  FDA under an Emergency Use Authorization (EUA). This EUA will remain  in effect (meaning this test can be used) for the duration of the  Covid-19 declaration under Section 564(b)(1) of the Act, 21  U.S.C. section 360bbb-3(b)(1), unless the authorization is  terminated or revoked. Performed at [facility], [place] [address], [place], [place] [address]          Radiology Studies: No results found.      Scheduled Meds: Continuous Infusions: .  sodium chloride       LOS: 3 days    Time spent: 30 minutes    Barb Merino, MD [facility] Pager [phone]

## 2019-09-25 NOTE — Anesthesia Preprocedure Evaluation (Addendum)
Anesthesia Evaluation  Patient identified by MRN, date of birth, ID band Patient awake    Reviewed: Allergy & Precautions, NPO status , Patient's Chart, lab work & pertinent test results  Airway Mallampati: II  TM Distance: >3 FB Neck ROM: Full    Dental no notable dental hx. (+) Teeth Intact   Pulmonary neg pulmonary ROS, former smoker,    Pulmonary exam normal breath sounds clear to auscultation       Cardiovascular negative cardio ROS Normal cardiovascular exam Rhythm:Regular Rate:Normal     Neuro/Psych TIAnegative psych ROS   GI/Hepatic negative GI ROS, Neg liver ROS,   Endo/Other  negative endocrine ROS  Renal/GU negative Renal ROS  negative genitourinary   Musculoskeletal negative musculoskeletal ROS (+)   Abdominal   Peds negative pediatric ROS (+)  Hematology  (+) anemia ,   Anesthesia Other Findings   Reproductive/Obstetrics negative OB ROS                            Anesthesia Physical Anesthesia Plan  ASA: III  Anesthesia Plan: MAC   Post-op Pain Management:    Induction: Intravenous  PONV Risk Score and Plan: 0  Airway Management Planned: Simple Face Mask  Additional Equipment:   Intra-op Plan:   Post-operative Plan:   Informed Consent: I have reviewed the patients History and Physical, chart, labs and discussed the procedure including the risks, benefits and alternatives for the proposed anesthesia with the patient or authorized representative who has indicated his/her understanding and acceptance.     Dental advisory given  Plan Discussed with: CRNA and Surgeon  Anesthesia Plan Comments:         Anesthesia Quick Evaluation

## 2019-09-25 NOTE — Transfer of Care (Signed)
Immediate Anesthesia Transfer of Care Note  Patient: Jill Pitts  Procedure(s) Performed: COLONOSCOPY WITH PROPOFOL (N/A ) BIOPSY  Patient Location: Endoscopy Unit  Anesthesia Type:MAC  Level of Consciousness: drowsy and patient cooperative  Airway & Oxygen Therapy: Patient Spontanous Breathing and Patient connected to nasal cannula oxygen  Post-op Assessment: Report given to RN and Post -op Vital signs reviewed and stable  Post vital signs: Reviewed and stable  Last Vitals:  Vitals Value Taken Time  BP    Temp    Pulse 64 09/25/19 1045  Resp 20 09/25/19 1045  SpO2 100 % 09/25/19 1045  Vitals shown include unvalidated device data.  Last Pain:  Vitals:   09/25/19 0856  TempSrc: Oral  PainSc: 0-No pain         Complications: No apparent anesthesia complications

## 2019-09-25 NOTE — Op Note (Signed)
[facility] Patient Name: Jill Pitts Procedure Date : 09/25/2019 MRN: [id number] Attending MD: Ronald Lobo , MD Date of Birth: 02/04/1929 CSN: [id number] Age: 86 Admit Type: Inpatient Procedure:                Colonoscopy Indications:              Last colonoscopy: date unknown (unable to locate                            last colonoscopy report), Hematochezia suggestive                            of diverticular bleed Providers:                Ronald Lobo, MD, Jeanella Cara, RN,                            Lazaro Arms, Technician, Corie Chiquito, Technician,                            Haze Boyden, CRNA Referring MD:              Medicines:                Monitored Anesthesia Care Complications:            No immediate complications. Estimated Blood Loss:     Estimated blood loss: 3 mL. Procedure:                Pre-Anesthesia Assessment:                           - Prior to the procedure, a History and Physical                            was performed, and patient medications and                            allergies were reviewed. The patient's tolerance of                            previous anesthesia was also reviewed. The risks                            and benefits of the procedure and the sedation                            options and risks were discussed with the patient.                            All questions were answered, and informed consent                            was obtained. Prior Anticoagulants: The patient has                            taken Plavix (clopidogrel), last  dose was 4 days                            prior to procedure. ASA Grade Assessment: III - A                            patient with severe systemic disease. After                            reviewing the risks and benefits, the patient was                            deemed in satisfactory condition to undergo the                            procedure.           After obtaining informed consent, the colonoscope                            was passed under direct vision. Throughout the                            procedure, the patient's blood pressure, pulse, and                            oxygen saturations were monitored continuously. The                            [id number] Olympus pediatric colonoscope                            was introduced through the anus and advanced to the                            the cecum, identified by appendiceal orifice and                            ileocecal valve. The colonoscopy was performed                            without difficulty. The patient tolerated the                            procedure well. The quality of the bowel                            preparation was excellent. Scope In: 10:06:06 AM Scope Out: 10:39:24 AM Scope Withdrawal Time: 0 hours 19 minutes 37 seconds  Total Procedure Duration: 0 hours 33 minutes 18 seconds  Findings:      The perianal and digital rectal examinations were normal except for       decreased anal sphincter tone.      A 20 mm polyp was found in the proximal transverse colon. The polyp was       semi-pedunculated. Biopsies  were taken with a cold forceps for       histology. Estimated blood loss: 2 mL.      A 14 mm polyp was found in the mid rectum. The polyp was       semi-pedunculated. Biopsies were taken with a cold forceps for       histology. Estimated blood loss: 1 mL.      Multiple medium-mouthed diverticula were found in the entire colon.       However, there was no blood in the colonic lumen.      There is no endoscopic evidence of mass, inflammation or angiodysplasia       in the entire colon.      The retroflexed view of the distal rectum and anal verge was normal and       showed no anal or rectal abnormalities.      DUE TO THE PATIENT'S RECENT PLAVIX EXPOSURE, HER ADVANCED AGE, AND THE       PROBLEM WITH RECENT BLEEDING, I ELECTED ONLY  TO SAMPLE, RATHER THAN       REMOVE, THE POLYPS ENCOUNTERED ON THIS EXAM. Impression:               - No active bleeding at time of exam.                           - One 20 mm polyp in the proximal transverse colon.                            Biopsied but not removed.                           - One 14 mm polyp in the mid rectum. Biopsied but                            not removed.                           - Diverticulosis in the entire examined colon,                            which is the presumed source of the patient's                            recent hematochezia. Her polyps were                            non-hemorrhagic in appearance. Recommendation:           - Await pathology results.                           - If the pathology report reveals adenomatous                            tissue, especially if high grade dysplasia is                            present, then consider repeat colonoscopy for  definitive polyp removal (date not yet determined). Procedure Code(s):        --- Professional ---                           [phone], Colonoscopy, flexible; with biopsy, single                            or multiple Diagnosis Code(s):        --- Professional ---                           K63.5, Polyp of colon                           K62.1, Rectal polyp                           K92.1, Melena (includes Hematochezia)                           K57.30, Diverticulosis of large intestine without                            perforation or abscess without bleeding CPT copyright 2019 American Medical Association. All rights reserved. The codes documented in this report are preliminary and upon coder review may  be revised to meet current compliance requirements. Ronald Lobo, MD 09/25/2019 11:02:56 AM This report has been signed electronically. Number of Addenda: 0

## 2019-09-25 NOTE — Anesthesia Postprocedure Evaluation (Signed)
Anesthesia Post Note  Patient: Jill Pitts  Procedure(s) Performed: COLONOSCOPY WITH PROPOFOL (N/A ) BIOPSY     Patient location during evaluation: PACU Anesthesia Type: MAC Level of consciousness: awake and alert Pain management: pain level controlled Vital Signs Assessment: post-procedure vital signs reviewed and stable Respiratory status: spontaneous breathing, nonlabored ventilation, respiratory function stable and patient connected to nasal cannula oxygen Cardiovascular status: stable and blood pressure returned to baseline Postop Assessment: no apparent nausea or vomiting Anesthetic complications: no    Last Vitals:  Vitals:   09/25/19 1045 09/25/19 1055  BP: 135/66 (!) 147/59  Pulse: 66 72  Resp: (!) 21 (!) 21  Temp: 36.5 C   SpO2: 100% 96%    Last Pain:  Vitals:   09/25/19 1055  TempSrc:   PainSc: Asleep                 Carmela Piechowski S

## 2019-09-25 NOTE — Interval H&P Note (Signed)
History and Physical Interval Note:  09/25/2019 9:11 AM  Jill Pitts  has presented today for surgery, with the diagnosis of GI bleed.  The various methods of treatment have been discussed with the patient. After consideration of risks, benefits and other options for treatment, the patient has consented to  Procedure(s): COLONOSCOPY WITH PROPOFOL (N/A) as a surgical intervention.  The patient's history has been reviewed, patient examined, no change in status, stable for surgery.  I have reviewed the patient's chart and labs.  Questions were answered to the patient's satisfaction.     Youlanda Mighty [name]

## 2019-09-25 NOTE — Anesthesia Procedure Notes (Signed)
Procedure Name: MAC Date/Time: 09/25/2019 10:01 AM Performed by: Kathryne Hitch, CRNA Pre-anesthesia Checklist: Patient identified, Emergency Drugs available, Suction available and Patient being monitored Patient Re-evaluated:Patient Re-evaluated prior to induction Oxygen Delivery Method: Nasal cannula Preoxygenation: Pre-oxygenation with 100% oxygen Induction Type: IV induction Dental Injury: Teeth and Oropharynx as per pre-operative assessment

## 2019-09-25 NOTE — Progress Notes (Addendum)
Patient's colonoscopy was well-tolerated.  (Please see dictated report for details)  There was no blood whatsoever in the colonic lumen, but extensive diverticulosis throughout most of the colon, which is the likely source of bleeding since no alternative source of hemorrhage was seen.  The patient also had 2 moderately large polyps, which I biopsied but did not remove in view of her recent Plavix treatment, and out of concern that if she did develop a post polypectomy hemorrhage, it would be difficult to differentiate that from a recurrent diverticular hemorrhage this soon after her most recent bleeding.  In  Impression:  1.  Currently quiescent diverticular bleed 2.  Moderately large, but benign-appearing, colonic polyps   Plan:  1.  I have ordered that her diet be advanced 2.  Because diverticular hemorrhage has a frequent propensity to recur after a day or 2 of appearing to have stopped, I would recommend that this patient be observed in-house for another day or 2, especially taking to account her age and her recent Plavix exposure 3.  I will have to discuss management of the polyps with the patient and her family, based on pathology results when they become available.  One very reasonable option would be simply to leave them in place, taking into account the patient's advanced age and the relatively low likelihood that they would turn cancerous in her natural lifetime. 4. Above discussed w/ pt's son, Fritz Pickerel, by phone.  Cleotis Nipper, M.D. Pager [phone] If no answer or after 5 PM call [phone]

## 2019-09-26 LAB — HEMOGLOBIN AND HEMATOCRIT, BLOOD
HCT: 28.7 % — ABNORMAL LOW (ref 36.0–46.0)
Hemoglobin: 9.5 g/dL — ABNORMAL LOW (ref 12.0–15.0)

## 2019-09-26 LAB — SURGICAL PATHOLOGY

## 2019-09-26 MED ORDER — PEG 3350-KCL-NA BICARB-NACL 420 G PO SOLR
4000.0000 mL | Freq: Once | ORAL | Status: AC
  Administered 2019-09-26: 4000 mL via ORAL
  Filled 2019-09-26: qty 4000

## 2019-09-26 NOTE — Progress Notes (Signed)
GASTROENTEROLOGY PROGRESS NOTE  Problem:   GI bleed  Subjective: Patient states she is feeling well overall.  She denies any bowel movements or BRBPR since colonoscopy yesterday.  She ordered breakfast and endorses feeling hungry.  Objective: General: elderly, lying in bed in no acute distress   H/H stable, VS stable.  Rectal biopsy showed tubular adenoma with focal high grade glandular dysplasia; no invasive carcinoma.  Proximal colonic polyp showed tubular adenoma without high grade dysplasia or malignancy.  Assessment/Plan: -Currently quiescent diverticular bleed.   -Rectal tubular adenoma with high-grade glandular dysplasia  Plan: -Plan for limited colonoscopy with rectal polypectomy tomorrow; will prep today.  Continue to hold Plavix for now.  I had a lengthy discussion (approximately 25 minutes) with patient, and her son by telephone, regarding the histologic findings and the risk-benefit analysis that goes into whether to do polyp removal (with attendant risk of post polypectomy hemorrhage and increased risk of TIA from having to be off Plavix an additional 1 to 2 weeks), as well as optimal timing for that.    In my opinion, since by tomorrow she will have been off Plavix 5 days, it would be optimal timing; we do not want her off her Plavix more than necessary, but we want it sufficiently out of her system to reduce the risk of post polypectomy hemorrhage.  Furthermore, she is already partially prepped.    I do not feel that the risk-benefit analysis necessitates removal of the proximal colonic polyp, which did not have high-grade dysplasia and which also would be at higher risk for post polypectomy hemorrhage and more technically difficult to control if it did happen.    Cleotis Nipper, M.D. 09/26/2019 11:42 AM  Pager [phone] If no answer or after 5 PM call [phone]

## 2019-09-26 NOTE — Progress Notes (Signed)
PROGRESS NOTE    Jill Pitts  [id number] DOB: Nov 16, 1928 DOA: 09/22/2019 PCP: Orpah Melter, MD    Brief Narrative:  86 year old female, functional and lives at home with history of TIA on Plavix and statins, history of diverticulosis presented to the emergency room with 2 large bloody bowel movements and dizziness and confusion and near syncope. In the emergency room, hemodynamically stable.  Hemoglobin 10.6.  CT abdomen unremarkable.  CT head and MRI brain normal.   Assessment & Plan:   Principal Problem:   Acute GI bleeding Active Problems:   Transient confusion   Acute blood loss anemia  Acute lower GI bleeding with acute blood loss anemia, symptomatic anemia: Diverticular bleed present. Status post colonoscopy, found to have diffuse diverticulosis with no active bleeding.   Clinically improving after initial bleeding.   Hemoglobin dropped from 10.6-8-7.6-7.4, 1 unit of PRBC-responded appropriately and remaining in normal ranges now. Monitor hemoglobin tomorrow morning. Patient was found to have 2 adenomatous polyps, one with local high-grade dysplasia. Schedule for polypectomy tomorrow.  History of TIA with brief episode of confusion: CT and MRI brain normal.  Holding Plavix until good clinical improvement.    Advance activities.  Ambulate in the hallway.  Check orthostatic. Schedule for repeat colonoscopy tomorrow.  DVT prophylaxis: SCDs Code Status: Full code Family Communication: None today.   Disposition Plan: patient is from home. Anticipated DC to home, Barriers to discharge, Scheduled for another procedure tomorrow inpatient.    Consultants:   Gastroenterology  Procedures:  Colonoscopy, polyps and diffuse diverticulosis.  No active bleeding. Antimicrobials:   None   Subjective: Seen and examined.  No overnight events.  She had one normal bowel movement today, no reported bleeding.  Still has some left lower quadrant abdominal pain.  Denies any  nausea vomiting. Objective: Vitals:   09/25/19 1105 09/25/19 1115 09/25/19 1951 09/26/19 0519  BP: (!) 156/56 (!) 165/58 (!) 127/53 (!) 123/51  Pulse: 71 69 89   Resp: 15 15 16 20   Temp:   98.1 F (36.7 C) 97.8 F (36.6 C)  TempSrc:   Oral Oral  SpO2: 97% 97% 97% 97%  Weight:       No intake or output data in the 24 hours ending 09/26/19 1324 Filed Weights   09/22/19 2345  Weight: 43 kg    Examination:  General exam: Appears calm and comfortable  Respiratory system: Clear to auscultation. Respiratory effort normal. Cardiovascular system: S1 & S2 heard, RRR. No JVD, murmurs, rubs, gallops or clicks. No pedal edema. Gastrointestinal system: Abdomen is nondistended, soft and mild tenderness left lower quadrant.  No organomegaly or masses felt. Normal bowel sounds heard. Central nervous system: Alert and oriented. No focal neurological deficits. Extremities: Symmetric 5 x 5 power. Skin: No rashes, lesions or ulcers Psychiatry: Judgement and insight appear normal. Mood & affect appropriate.     Data Reviewed: I have personally reviewed following labs and imaging studies  CBC: Recent Labs  Lab 09/22/19 1532 09/23/19 0104 09/23/19 0402 09/23/19 0402 09/23/19 1027 09/23/19 1027 09/23/19 1850 09/23/19 1850 09/23/19 2239 09/24/19 0446 09/24/19 1602 09/25/19 0330 09/26/19 0301  WBC 14.8*   < > 7.7  --  6.1  --  6.9  --  7.3 6.9  --   --   --   NEUTROABS 13.9*  --   --   --   --   --   --   --   --   --   --   --   --  HGB 10.6*   < > 7.6*   < > 7.4*   < > 10.4*   < > 9.7* 9.4* 10.3* 10.2* 9.5*  HCT 34.0*   < > 23.7*   < > 23.1*   < > 32.0*   < > 29.3* 28.4* 30.5* 31.6* 28.7*  MCV 92.6   < > 90.1  --  90.6  --  90.9  --  89.6 89.6  --   --   --   PLT 245   < > 184  --  167  --  179  --  180 171  --   --   --    < > = values in this interval not displayed.   Basic Metabolic Panel: Recent Labs  Lab 09/22/19 1532 09/23/19 0402  NA 142 143  K 4.0 3.7  CL 108 112*   CO2 22 25  GLUCOSE 139* 105*  BUN 23 14  CREATININE 0.82 0.62  CALCIUM 8.7* 8.0*   GFR: CrCl cannot be calculated (Unknown ideal weight.). Liver Function Tests: Recent Labs  Lab 09/22/19 1532  AST 21  ALT 14  ALKPHOS 33*  BILITOT 0.5  PROT 5.5*  ALBUMIN 3.2*   Recent Labs  Lab 09/22/19 1532  LIPASE 39   No results for input(s): AMMONIA in the last 168 hours. Coagulation Profile: Recent Labs  Lab 09/22/19 1543  INR 1.0   Cardiac Enzymes: No results for input(s): CKTOTAL, CKMB, CKMBINDEX, TROPONINI in the last 168 hours. BNP (last 3 results) No results for input(s): PROBNP in the last 8760 hours. HbA1C: No results for input(s): HGBA1C in the last 72 hours. CBG: No results for input(s): GLUCAP in the last 168 hours. Lipid Profile: No results for input(s): CHOL, HDL, LDLCALC, TRIG, CHOLHDL, LDLDIRECT in the last 72 hours. Thyroid Function Tests: No results for input(s): TSH, T4TOTAL, FREET4, T3FREE, THYROIDAB in the last 72 hours. Anemia Panel: No results for input(s): VITAMINB12, FOLATE, FERRITIN, TIBC, IRON, RETICCTPCT in the last 72 hours. Sepsis Labs: No results for input(s): PROCALCITON, LATICACIDVEN in the last 168 hours.  Recent Results (from the past 240 hour(s))  Respiratory Panel by RT PCR (Flu A&B, Covid) -     Status: None   Collection Time: 09/22/19  9:26 PM  Result Value Ref Range Status   SARS Coronavirus 2 by RT PCR NEGATIVE NEGATIVE Final    Comment: (NOTE) SARS-CoV-2 target nucleic acids are NOT DETECTED. The SARS-CoV-2 RNA is generally detectable in upper respiratoy specimens during the acute phase of infection. The lowest concentration of SARS-CoV-2 viral copies this assay can detect is 131 copies/mL. A negative result does not preclude SARS-Cov-2 infection and should not be used as the sole basis for treatment or other patient management decisions. A negative result may occur with  improper specimen collection/handling, submission of  specimen other than nasopharyngeal swab, presence of viral mutation(s) within the areas targeted by this assay, and inadequate number of viral copies (<131 copies/mL). A negative result must be combined with clinical observations, patient history, and epidemiological information. The expected result is Negative. Fact Sheet for Patients:  PinkCheek.be Fact Sheet for Healthcare Providers:  GravelBags.it This test is not yet ap proved or cleared by the [place] FDA and  has been authorized for detection and/or diagnosis of SARS-CoV-2 by FDA under an Emergency Use Authorization (EUA). This EUA will remain  in effect (meaning this test can be used) for the duration of the COVID-19 declaration under Section 564(b)(1) of the  Act, 21 U.S.C. section 360bbb-3(b)(1), unless the authorization is terminated or revoked sooner.    Influenza A by PCR NEGATIVE NEGATIVE Final   Influenza B by PCR NEGATIVE NEGATIVE Final    Comment: (NOTE) The Xpert Xpress SARS-CoV-2/FLU/RSV assay is intended as an aid in  the diagnosis of influenza from Nasopharyngeal swab specimens and  should not be used as a sole basis for treatment. Nasal washings and  aspirates are unacceptable for Xpert Xpress SARS-CoV-2/FLU/RSV  testing. Fact Sheet for Patients: PinkCheek.be Fact Sheet for Healthcare Providers: GravelBags.it This test is not yet approved or cleared by the [place] FDA and  has been authorized for detection and/or diagnosis of SARS-CoV-2 by  FDA under an Emergency Use Authorization (EUA). This EUA will remain  in effect (meaning this test can be used) for the duration of the  Covid-19 declaration under Section 564(b)(1) of the Act, 21  U.S.C. section 360bbb-3(b)(1), unless the authorization is  terminated or revoked. Performed at [facility], [place] [address], [place], [place]  [address]          Radiology Studies: No results found.      Scheduled Meds: . polyethylene glycol-electrolytes  4,000 mL Oral Once   Continuous Infusions: . sodium chloride       LOS: 4 days    Time spent: 30 minutes    Barb Merino, MD [facility] Pager [phone]

## 2019-09-27 ENCOUNTER — Inpatient Hospital Stay (HOSPITAL_COMMUNITY): Payer: Medicare HMO | Admitting: Certified Registered Nurse Anesthetist

## 2019-09-27 ENCOUNTER — Encounter (HOSPITAL_COMMUNITY): Payer: Self-pay | Admitting: Internal Medicine

## 2019-09-27 ENCOUNTER — Encounter (HOSPITAL_COMMUNITY)
Admission: EM | Disposition: A | Discharge status: Home / Self Care | Payer: Self-pay | Source: Home / Self Care | Attending: Internal Medicine

## 2019-09-27 HISTORY — PX: SUBMUCOSAL LIFTING INJECTION: SHX6855

## 2019-09-27 HISTORY — PX: POLYPECTOMY: SHX5525

## 2019-09-27 HISTORY — PX: SUBMUCOSAL TATTOO INJECTION: SHX6856

## 2019-09-27 HISTORY — PX: FLEXIBLE SIGMOIDOSCOPY: SHX5431

## 2019-09-27 LAB — HEMOGLOBIN AND HEMATOCRIT, BLOOD
HCT: 28.1 % — ABNORMAL LOW (ref 36.0–46.0)
Hemoglobin: 9.2 g/dL — ABNORMAL LOW (ref 12.0–15.0)

## 2019-09-27 SURGERY — POLYPECTOMY
Anesthesia: Monitor Anesthesia Care

## 2019-09-27 MED ORDER — PROPOFOL 10 MG/ML IV BOLUS
INTRAVENOUS | Status: DC | PRN
  Administered 2019-09-27: 20 mg via INTRAVENOUS
  Administered 2019-09-27 (×2): 15 mg via INTRAVENOUS

## 2019-09-27 MED ORDER — SPOT INK MARKER SYRINGE KIT
PACK | SUBMUCOSAL | Status: DC | PRN
  Administered 2019-09-27: 1 mL via SUBMUCOSAL

## 2019-09-27 MED ORDER — LIDOCAINE HCL (CARDIAC) PF 100 MG/5ML IV SOSY
PREFILLED_SYRINGE | INTRAVENOUS | Status: DC | PRN
  Administered 2019-09-27: 60 mg via INTRAVENOUS

## 2019-09-27 MED ORDER — SODIUM CHLORIDE 0.9 % IV SOLN: INTRAVENOUS | Status: DC

## 2019-09-27 MED ORDER — LACTATED RINGERS IV SOLN: INTRAVENOUS | Status: DC | PRN

## 2019-09-27 MED ORDER — SPOT INK MARKER SYRINGE KIT
PACK | SUBMUCOSAL | Status: AC
  Filled 2019-09-27: qty 5

## 2019-09-27 MED ORDER — PROPOFOL 500 MG/50ML IV EMUL
INTRAVENOUS | Status: DC | PRN
  Administered 2019-09-27: 75 ug/kg/min via INTRAVENOUS

## 2019-09-27 MED ORDER — PHENYLEPHRINE HCL (PRESSORS) 10 MG/ML IV SOLN
INTRAVENOUS | Status: DC | PRN
  Administered 2019-09-27: 80 ug via INTRAVENOUS

## 2019-09-27 SURGICAL SUPPLY — 21 items

## 2019-09-27 NOTE — Anesthesia Procedure Notes (Signed)
Procedure Name: MAC Date/Time: 09/27/2019 9:00 AM Performed by: Breckon Reeves T, CRNA Pre-anesthesia Checklist: Patient identified, Emergency Drugs available, Suction available and Patient being monitored Patient Re-evaluated:Patient Re-evaluated prior to induction Oxygen Delivery Method: Nasal cannula Induction Type: IV induction Dental Injury: Teeth and Oropharynx as per pre-operative assessment

## 2019-09-27 NOTE — Transfer of Care (Signed)
Immediate Anesthesia Transfer of Care Note  Patient: Jill Pitts  Procedure(s) Performed: POLYPECTOMY SUBMUCOSAL TATTOO INJECTION FLEXIBLE SIGMOIDOSCOPY (N/A )  Patient Location: PACU and Endoscopy Unit  Anesthesia Type:MAC  Level of Consciousness: drowsy  Airway & Oxygen Therapy: Patient Spontanous Breathing and Patient connected to nasal cannula oxygen  Post-op Assessment: Report given to RN, Post -op Vital signs reviewed and stable and Patient moving all extremities  Post vital signs: Reviewed and stable  Last Vitals:  Vitals Value Taken Time  BP 140/39 09/27/19 0938  Temp    Pulse 68 09/27/19 0939  Resp 22 09/27/19 0939  SpO2 99 % 09/27/19 0939  Vitals shown include unvalidated device data.  Last Pain:  Vitals:   09/27/19 0823  TempSrc: Oral  PainSc: 4       Patients Stated Pain Goal: 3 (79/81/02 5486)  Complications: No apparent anesthesia complications

## 2019-09-27 NOTE — Progress Notes (Signed)
Patient's sigmoidoscopy with polypectomy was well-tolerated.  There was no blood whatsoever in the colonic lumen, consistent with persistent quiescence of the patient's recent (apparent diverticular) or bleed.  The rectal polyp was readily removed, with what appeared to be a clear margin and no evidence of bleeding.  Recommendations:  1.  I have returned the patient to a regular diet  2.  I would favor remaining off Plavix for another week, at which time it can be resumed  3.  The patient should contact me in the event of any recurrent rectal bleeding, in which case we would want to do repeat sigmoidoscopic evaluation to examine the polypectomy site and clip it if necessary.  4.  I would favor observation in the hospital 1 more day, because the greatest risk of post polypectomy bleeding is usually in the first 1 to 2 days.  Discharge tomorrow would be reasonable, if no evidence of bleeding by that time.  5.  I will contact the patient with the pathology results on the polyp when available.  6.  Tentatively, I would consider a repeat colonoscopy in a couple of years, if the patient remains in stable health, despite her advanced age, to rebiopsy the proximal colonic polyp (which did not have high-grade dysplasia, and which I have elected to allow to remain in place because I think the risk of its removal would exceed the benefit); if it were developing high-grade dysplasia at that time, the risk of removal might then be justified.  On that same exam, we would of course be able to reexamine the rectal polypectomy site for any evidence of local recurrence.  Cleotis Nipper, M.D. Pager [phone] If no answer or after 5 PM call [phone]

## 2019-09-27 NOTE — Care Management Important Message (Signed)
Important Message  Patient Details  Name: Jill Pitts MRN: [id number] Date of Birth: [birth year]   Medicare Important Message Given:  Yes     [name] 09/27/2019, 1:21 PM

## 2019-09-27 NOTE — Anesthesia Preprocedure Evaluation (Signed)
Anesthesia Evaluation  Patient identified by MRN, date of birth, ID band Patient awake    Reviewed: Allergy & Precautions, NPO status , Patient's Chart, lab work & pertinent test results  Airway Mallampati: II  TM Distance: >3 FB Neck ROM: Full    Dental no notable dental hx. (+) Teeth Intact   Pulmonary neg pulmonary ROS, former smoker,    Pulmonary exam normal breath sounds clear to auscultation       Cardiovascular negative cardio ROS Normal cardiovascular exam Rhythm:Regular Rate:Normal     Neuro/Psych On plavix and statin for hx TIA- plavix currently being held d/t bloody BMs TIAnegative psych ROS   GI/Hepatic Neg liver ROS, Rectal polyp   Endo/Other  negative endocrine ROS  Renal/GU negative Renal ROS  negative genitourinary   Musculoskeletal  (+) Arthritis , Osteoarthritis,  Cervical DDD with myelopathy- no surgery   Abdominal   Peds negative pediatric ROS (+)  Hematology  (+) anemia , hct 28.1   Anesthesia Other Findings   Reproductive/Obstetrics negative OB ROS                             Anesthesia Physical  Anesthesia Plan  ASA: III  Anesthesia Plan: MAC   Post-op Pain Management:    Induction: Intravenous  PONV Risk Score and Plan: Propofol infusion and TIVA  Airway Management Planned: Simple Face Mask  Additional Equipment: None  Intra-op Plan:   Post-operative Plan:   Informed Consent: I have reviewed the patients History and Physical, chart, labs and discussed the procedure including the risks, benefits and alternatives for the proposed anesthesia with the patient or authorized representative who has indicated his/her understanding and acceptance.     Dental advisory given  Plan Discussed with: CRNA  Anesthesia Plan Comments:         Anesthesia Quick Evaluation

## 2019-09-27 NOTE — Progress Notes (Signed)
PROGRESS NOTE    AMAYIA Pitts  [id number] DOB: [birth year] DOA: 09/22/2019 PCP: Orpah Melter, MD    Brief Narrative:  86 year old female, functional and lives at home with history of TIA on Plavix and statins, history of diverticulosis presented to the emergency room with 2 large bloody bowel movements and dizziness and confusion and near syncope. In the emergency room, hemodynamically stable.  Hemoglobin 10.6.  CT abdomen unremarkable.  CT head and MRI brain normal.   Assessment & Plan:   Principal Problem:   Acute GI bleeding Active Problems:   Transient confusion   Acute blood loss anemia  Acute lower GI bleeding with acute blood loss anemia, symptomatic anemia: Diverticular bleed present. Status post colonoscopy, found to have diffuse diverticulosis with no active bleeding.   Clinically improving after initial bleeding.   Hemoglobin dropped from 10.6-8-7.6-7.4, 1 unit of PRBC-responded appropriately and remaining in normal ranges now. Monitor hemoglobin tomorrow morning. Patient was found to have 2 adenomatous polyps, one with local high-grade dysplasia. Schedule for colon and polypectomy today. Prepped and off Plavix for 5 days.  History of TIA with brief episode of confusion: CT and MRI brain normal.  Holding Plavix until good clinical improvement.     DVT prophylaxis: SCDs Code Status: Full code Family Communication: none  Disposition Plan: patient is from home. Anticipated DC to home, Barriers to discharge, Scheduled for another procedure today.  If patient has adequate improvement, anticipate discharge home tomorrow.   Consultants:   Gastroenterology  Procedures:  Colonoscopy, polyps and diffuse diverticulosis.  No active bleeding. Antimicrobials:   None   Subjective: Patient seen and examined.  No overnight events.  She has some anxiety associated with procedure.  Continues to have mild left lower quadrant abdominal pain. Objective: Vitals:   09/26/19 2032 09/26/19 2304 09/27/19 0506 09/27/19 0823  BP: (!) 157/72 (!) 161/68 (!) 152/74 (!) 183/65  Pulse: 86  86 87  Resp: 19 15 16 18   Temp: 98.3 F (36.8 C)  98.2 F (36.8 C) 98.3 F (36.8 C)  TempSrc: Oral  Oral Oral  SpO2: 98%  99% 96%  Weight:        Intake/Output Summary (Last 24 hours) at 09/27/2019 0832 Last data filed at 09/27/2019 0502 Gross per 24 hour  Intake 0 ml  Output --  Net 0 ml   Filed Weights   09/22/19 2345  Weight: 43 kg    Examination:  General exam: Appears calm and comfortable, anxious Respiratory system: Clear to auscultation. Respiratory effort normal. Cardiovascular system: S1 & S2 heard, RRR. No JVD, murmurs, rubs, gallops or clicks. No pedal edema. Gastrointestinal system: Abdomen is nondistended, soft and mild tenderness left lower quadrant.  No organomegaly or masses felt. Normal bowel sounds heard. Central nervous system: Alert and oriented. No focal neurological deficits. Extremities: Symmetric 5 x 5 power. Skin: No rashes, lesions or ulcers Psychiatry: Judgement and insight appear normal. Mood & affect appropriate.     Data Reviewed: I have personally reviewed following labs and imaging studies  CBC: Recent Labs  Lab 09/22/19 1532 09/23/19 0104 09/23/19 0402 09/23/19 0402 09/23/19 1027 09/23/19 1027 09/23/19 1850 09/23/19 1850 09/23/19 2239 09/23/19 2239 09/24/19 0446 09/24/19 1602 09/25/19 0330 09/26/19 0301 09/27/19 0349  WBC 14.8*   < > 7.7  --  6.1  --  6.9  --  7.3  --  6.9  --   --   --   --   NEUTROABS 13.9*  --   --   --   --   --   --   --   --   --   --   --   --   --   --  HGB 10.6*   < > 7.6*   < > 7.4*   < > 10.4*   < > 9.7*   < > 9.4* 10.3* 10.2* 9.5* 9.2*  HCT 34.0*   < > 23.7*   < > 23.1*   < > 32.0*   < > 29.3*   < > 28.4* 30.5* 31.6* 28.7* 28.1*  MCV 92.6   < > 90.1  --  90.6  --  90.9  --  89.6  --  89.6  --   --   --   --   PLT 245   < > 184  --  167  --  179  --  180  --  171  --   --   --    --    < > = values in this interval not displayed.   Basic Metabolic Panel: Recent Labs  Lab 09/22/19 1532 09/23/19 0402  NA 142 143  K 4.0 3.7  CL 108 112*  CO2 22 25  GLUCOSE 139* 105*  BUN 23 14  CREATININE 0.82 0.62  CALCIUM 8.7* 8.0*   GFR: CrCl cannot be calculated (Unknown ideal weight.). Liver Function Tests: Recent Labs  Lab 09/22/19 1532  AST 21  ALT 14  ALKPHOS 33*  BILITOT 0.5  PROT 5.5*  ALBUMIN 3.2*   Recent Labs  Lab 09/22/19 1532  LIPASE 39   No results for input(s): AMMONIA in the last 168 hours. Coagulation Profile: Recent Labs  Lab 09/22/19 1543  INR 1.0   Cardiac Enzymes: No results for input(s): CKTOTAL, CKMB, CKMBINDEX, TROPONINI in the last 168 hours. BNP (last 3 results) No results for input(s): PROBNP in the last 8760 hours. HbA1C: No results for input(s): HGBA1C in the last 72 hours. CBG: No results for input(s): GLUCAP in the last 168 hours. Lipid Profile: No results for input(s): CHOL, HDL, LDLCALC, TRIG, CHOLHDL, LDLDIRECT in the last 72 hours. Thyroid Function Tests: No results for input(s): TSH, T4TOTAL, FREET4, T3FREE, THYROIDAB in the last 72 hours. Anemia Panel: No results for input(s): VITAMINB12, FOLATE, FERRITIN, TIBC, IRON, RETICCTPCT in the last 72 hours. Sepsis Labs: No results for input(s): PROCALCITON, LATICACIDVEN in the last 168 hours.  Recent Results (from the past 240 hour(s))  Respiratory Panel by RT PCR (Flu A&B, Covid) -     Status: None   Collection Time: 09/22/19  9:26 PM  Result Value Ref Range Status   SARS Coronavirus 2 by RT PCR NEGATIVE NEGATIVE Final    Comment: (NOTE) SARS-CoV-2 target nucleic acids are NOT DETECTED. The SARS-CoV-2 RNA is generally detectable in upper respiratoy specimens during the acute phase of infection. The lowest concentration of SARS-CoV-2 viral copies this assay can detect is 131 copies/mL. A negative result does not preclude SARS-Cov-2 infection and should not be  used as the sole basis for treatment or other patient management decisions. A negative result may occur with  improper specimen collection/handling, submission of specimen other than nasopharyngeal swab, presence of viral mutation(s) within the areas targeted by this assay, and inadequate number of viral copies (<131 copies/mL). A negative result must be combined with clinical observations, patient history, and epidemiological information. The expected result is Negative. Fact Sheet for Patients:  PinkCheek.be Fact Sheet for Healthcare Providers:  GravelBags.it This test is not yet ap proved or cleared by the [place] FDA and  has been authorized for detection and/or diagnosis of SARS-CoV-2 by FDA under an Emergency Use Authorization (EUA). This EUA will  remain  in effect (meaning this test can be used) for the duration of the COVID-19 declaration under Section 564(b)(1) of the Act, 21 U.S.C. section 360bbb-3(b)(1), unless the authorization is terminated or revoked sooner.    Influenza A by PCR NEGATIVE NEGATIVE Final   Influenza B by PCR NEGATIVE NEGATIVE Final    Comment: (NOTE) The Xpert Xpress SARS-CoV-2/FLU/RSV assay is intended as an aid in  the diagnosis of influenza from Nasopharyngeal swab specimens and  should not be used as a sole basis for treatment. Nasal washings and  aspirates are unacceptable for Xpert Xpress SARS-CoV-2/FLU/RSV  testing. Fact Sheet for Patients: PinkCheek.be Fact Sheet for Healthcare Providers: GravelBags.it This test is not yet approved or cleared by the [place] FDA and  has been authorized for detection and/or diagnosis of SARS-CoV-2 by  FDA under an Emergency Use Authorization (EUA). This EUA will remain  in effect (meaning this test can be used) for the duration of the  Covid-19 declaration under Section 564(b)(1) of the  Act, 21  U.S.C. section 360bbb-3(b)(1), unless the authorization is  terminated or revoked. Performed at [facility], [place] [address], [place], [place] [address]          Radiology Studies: No results found.      Scheduled Meds:  Continuous Infusions: . sodium chloride    . sodium chloride 20 mL/hr at 09/27/19 0502     LOS: 5 days    Time spent: 25 minutes    Barb Merino, MD [facility] Pager [phone]

## 2019-09-27 NOTE — Op Note (Signed)
[facility] Patient Name: Jill Pitts Procedure Date : 09/27/2019 MRN: [id number] Attending MD: Ronald Lobo , MD Date of Birth: 1929-08-04 CSN: [id number] Age: 86 Admit Type: Inpatient Procedure:                Colonoscopy Indications:              Last colonoscopy 2 days ago, Follow-up for revmoval                            of rectal polyp with carcinoma in situ on biopsies,                            noted on that procedure but not removed at that                            time because of recent Plavix use and patient's                            advanced age and recent hematochezia due                            diverticulosis. Providers:                Ronald Lobo, MD, Glori Bickers, RN, Lazaro Arms,                            Technician, Corie Chiquito, Technician, Raphael Gibney, CRNA Referring MD:              Medicines:                Monitored Anesthesia Care Complications:            No immediate complications. Estimated Blood Loss:     Estimated blood loss: none. Procedure:                Pre-Anesthesia Assessment:                           - Prior to the procedure, a History and Physical                            was performed, and patient medications and                            allergies were reviewed. The patient's tolerance of                            previous anesthesia was also reviewed. The risks                            and benefits of the procedure and the sedation                            options  and risks were discussed with the patient.                            All questions were answered, and informed consent                            was obtained. Prior Anticoagulants: The patient has                            taken Plavix (clopidogrel), last dose was 5 days                            prior to procedure. ASA Grade Assessment: III - A                            patient with severe systemic disease. After                             reviewing the risks and benefits, the patient was                            deemed in satisfactory condition to undergo the                            procedure.                           After obtaining informed consent, the colonoscope                            was passed under direct vision. Throughout the                            procedure, the patient's blood pressure, pulse, and                            oxygen saturations were monitored continuously. The                            [id number] Olympus ultra slim colonoscope                            was introduced through the anus and advanced to the                            the sigmoid colon, approximately 30 cm. Further                            advancement was not performed because the intent of                            this procedure was specifically to remove the  patient's rectal polyp. The procedure was performed                            without difficulty. The patient tolerated the                            procedure well. The quality of the bowel                            preparation was good. Scope In: 9:13:53 AM Scope Out: 9:20:10 AM Total Procedure Duration: 0 hours 6 minutes 17 seconds  Findings:      A 10 mm polyp was found in the mid rectum, as previously noted. The       polyp was semi-sessile. The polyp was removed with a saline       injection-lift technique using a hot snare. Resection and retrieval were       complete. The eschar looked clean-based. Estimated blood loss: none.       Area was tattooed proximally and distally with an injection of 1 mL of       Spot (carbon black).      Multiple medium-mouthed diverticula were found in the sigmoid colon.      There was no blood in the colonic lumen, consistent with persistent       quiescence of the patient's recent (diverticular?) bleed.      The retroflexed view of the distal rectum and  anal verge was normal and       showed no anal or rectal abnormalities other than moderate internal       hemorroids. Impression:               - One 10 mm polyp in the mid rectum, removed using                            injection-lift and a hot snare. Resected and                            retrieved. Tattooed.                           - Diverticulosis in the sigmoid colon, without                            evidence of further bleeding.                           - Internal hemorroids.                           - The distal rectum and anal verge are normal on                            retroflexion view. Recommendation:           - Await pathology results.                           - Consider repeat colonoscopy in 2 years for  surveillance, depending on patient's health status                            at that time. Also, if margin on today's                            polypectomy specimen is uncertain, consider                            flexible sigmoidoscopy in 1 year to examine site                            for evidence of local recurrence of polyp, or                            residual polyp tissue. Procedure Code(s):        --- Professional ---                           [phone], 52, Colonoscopy, flexible; with removal of                            tumor(s), polyp(s), or other lesion(s) by snare                            technique                           [id number], 52, Colonoscopy, flexible; with directed                            submucosal injection(s), any substance Diagnosis Code(s):        --- Professional ---                           K62.1, Rectal polyp                           Z86.004, Personal history of in-situ neoplasm of                            other and unspecified digestive organs                           K57.30, Diverticulosis of large intestine without                            perforation or abscess without bleeding CPT  copyright 2019 American Medical Association. All rights reserved. The codes documented in this report are preliminary and upon coder review may  be revised to meet current compliance requirements. Ronald Lobo, MD 09/27/2019 9:47:48 AM This report has been signed electronically. Number of Addenda: 0

## 2019-09-27 NOTE — Anesthesia Postprocedure Evaluation (Signed)
Anesthesia Post Note  Patient: Jill Pitts  Procedure(s) Performed: POLYPECTOMY SUBMUCOSAL TATTOO INJECTION FLEXIBLE SIGMOIDOSCOPY (N/A )     Patient location during evaluation: PACU Anesthesia Type: MAC Level of consciousness: awake and alert Pain management: pain level controlled Vital Signs Assessment: post-procedure vital signs reviewed and stable Respiratory status: spontaneous breathing, nonlabored ventilation and respiratory function stable Cardiovascular status: blood pressure returned to baseline and stable Postop Assessment: no apparent nausea or vomiting Anesthetic complications: no    Last Vitals:  Vitals:   09/27/19 0823 09/27/19 0938  BP: (!) 183/65 (!) 140/39  Pulse: 87 67  Resp: 18 (!) 23  Temp: 36.8 C 36.9 C  SpO2: 96% 99%    Last Pain:  Vitals:   09/27/19 0938  TempSrc: Axillary  PainSc: 0-No pain                 Pervis Hocking

## 2019-09-27 NOTE — Interval H&P Note (Signed)
History and Physical Interval Note:  09/27/2019 8:58 AM  Jill Pitts  has presented today for surgery, with the diagnosis of rectal polyp.  The various methods of treatment have been discussed with the patient and family. After consideration of risks, benefits and other options for treatment, the patient has consented to  Procedure(s) with comments: COLONOSCOPY WITH PROPOFOL (N/A) - anticipate limited exam, for the purpose of removing a rectal polyp as a surgical intervention.  The patient's history has been reviewed, patient examined, no change in status, stable for surgery.  I have reviewed the patient's chart and labs.  Questions were answered to the patient's satisfaction.     Youlanda Mighty [name]

## 2019-09-28 LAB — COMPREHENSIVE METABOLIC PANEL
ALT: 11 U/L (ref 0–44)
AST: 14 U/L — ABNORMAL LOW (ref 15–41)
Albumin: 2.5 g/dL — ABNORMAL LOW (ref 3.5–5.0)
Alkaline Phosphatase: 34 U/L — ABNORMAL LOW (ref 38–126)
Anion gap: 14 (ref 5–15)
BUN: 6 mg/dL — ABNORMAL LOW (ref 8–23)
CO2: 28 mmol/L (ref 22–32)
Calcium: 8.8 mg/dL — ABNORMAL LOW (ref 8.9–10.3)
Chloride: 100 mmol/L (ref 98–111)
Creatinine, Ser: 0.69 mg/dL (ref 0.44–1.00)
GFR calc Af Amer: >60 mL/min (ref >60)
GFR calc non Af Amer: >60 mL/min (ref >60)
Glucose, Bld: 99 mg/dL (ref 70–99)
Potassium: 2.9 mmol/L — ABNORMAL LOW (ref 3.5–5.1)
Sodium: 142 mmol/L (ref 135–145)
Total Bilirubin: 1 mg/dL (ref 0.3–1.2)
Total Protein: 5.1 g/dL — ABNORMAL LOW (ref 6.5–8.1)

## 2019-09-28 LAB — HEMOGLOBIN AND HEMATOCRIT, BLOOD
HCT: 28.8 % — ABNORMAL LOW (ref 36.0–46.0)
Hemoglobin: 9.5 g/dL — ABNORMAL LOW (ref 12.0–15.0)

## 2019-09-28 LAB — MAGNESIUM: Magnesium: 1.8 mg/dL (ref 1.7–2.4)

## 2019-09-28 LAB — SURGICAL PATHOLOGY

## 2019-09-28 LAB — PHOSPHORUS: Phosphorus: 3.4 mg/dL (ref 2.5–4.6)

## 2019-09-28 MED ORDER — POTASSIUM CHLORIDE CRYS ER 20 MEQ PO TBCR
40.0000 meq | EXTENDED_RELEASE_TABLET | ORAL | Status: AC
  Administered 2019-09-28 (×2): 40 meq via ORAL
  Filled 2019-09-28 (×2): qty 2

## 2019-09-28 MED ORDER — BIOTIN 1 MG PO CAPS: 1.0000 mg | ORAL_CAPSULE | Freq: Every day | ORAL | Status: AC

## 2019-09-28 MED ORDER — MAGNESIUM SULFATE 2 GM/50ML IV SOLN
2.0000 g | Freq: Once | INTRAVENOUS | Status: AC
  Administered 2019-09-28: 2 g via INTRAVENOUS
  Filled 2019-09-28: qty 50

## 2019-09-28 NOTE — Progress Notes (Signed)
D/C instructions given to pt. Medications reviewed. All questions answered. IV removed, clean and intact. Son to escort pt home.  Clyde Canterbury, RN

## 2019-09-28 NOTE — Progress Notes (Addendum)
Some LUQ gas pains yest afternoon, now much improved.  Minimal BM's, no visible blood per pt.  Hgb stable 9.5  Abd soft, NT    Pt alert, NAD.  Pathology on remainder of polyp (removed yesterday) shows NO HIGH GRADE DYSPLASIA.  Recomm:  1 .ok for dischg 2.Please see yesterday's note for Plavix and f/u recomm's 3 Pt should f/u w/ her PCP in 1-2 weeks to recheck hgb perhaps check stool for occult bld  Please call me if any questions.  Cleotis Nipper, M.D. Pager [phone] If no answer or after 5 PM call [phone]  Addendum:  Pt educated re. Natural hx of divertic bld, absence of preventive interventions (other than surgery), absence of need for early f/u for polyp since resected polyp did not show any further HGD.

## 2019-09-28 NOTE — Discharge Summary (Signed)
Physician Discharge Summary  Jill Pitts [id number] DOB: [birth year] DOA: 09/22/2019  PCP: Orpah Melter, MD  Admit date: 09/22/2019 Discharge date: 09/28/2019  Admitted From: home  Disposition:  Home   Recommendations for Outpatient Follow-up:  1. Follow up with PCP in 1-2 weeks 2. CBC in 1 week. 3. Holding Plavix for another 6 days   Discharge Condition: Stable CODE STATUS: Full code Diet recommendation: Regular diet, stool softener, plenty of liquids and fiber diet.  Discharge summary:   86 year old female, functional and lives at home with history of TIA on Plavix and statins, history of diverticulosis presented to the emergency room with 2 large bloody bowel movements and dizziness and confusion and near syncope. In the emergency room, hemodynamically stable. Hemoglobin 10.6. CT abdomen unremarkable. CT head and MRI brain normal.  Acute lower GI bleeding with acute blood loss anemia, symptomatic anemia: Diverticular bleed present. Status post colonoscopy, found to have diffuse diverticulosis with no active bleeding.   Clinically improving after initial bleeding.   Hemoglobin dropped from 10.6-8-7.6-7.4, 1 unit of PRBC-responded appropriately and remaining more than 9 for last 2 days. Patient was found to have 2 adenomatous polyps, one with local high-grade dysplasia. Patient underwent repeat colonoscopy and polypectomy of the polyp with high-grade dysplasia. Monitored in the hospital.  Remains a stable. She is going home.  Due to polypectomy, advised to hold Plavix for 1 week to resume on 2/17.   Discharge Diagnoses:  Principal Problem:   Acute GI bleeding Active Problems:   Transient confusion   Acute blood loss anemia    Discharge Instructions  Discharge Instructions    Diet - low sodium heart healthy   Complete by: As directed    Discharge instructions   Complete by: As directed    Resume taking Plavix on 2/17 if no problems felt. Repeat CBC in 1 week.    Increase activity slowly   Complete by: As directed      Allergies as of 09/28/2019      Reactions   Gabapentin    Tachycardia      Medication List    TAKE these medications   Biotin 1 MG Caps Take 1 mg by mouth daily. What changed: how much to take   Calcium + D3 600-200 MG-UNIT Tabs Take 1 tablet by mouth 2 (two) times daily.   clopidogrel 75 MG tablet Commonly known as: PLAVIX Take 75 mg by mouth daily.   Co-Enzyme Q-10 30 MG Caps Take 30 mg by mouth.   Fish Oil Oil Take 1,400 mg by mouth 2 (two) times daily.   lovastatin 20 MG tablet Commonly known as: MEVACOR Take 20 mg by mouth daily.   MULTIVITAMIN ADULT PO Take 1 tablet by mouth daily.       Allergies  Allergen Reactions  . Gabapentin     Tachycardia    Consultations:  GI   Procedures/Studies: CT Head Wo Contrast  Result Date: 09/22/2019 CLINICAL DATA:  86 year old female with fall.  Concern for TIA. EXAM: CT HEAD WITHOUT CONTRAST TECHNIQUE: Contiguous axial images were obtained from the base of the skull through the vertex without intravenous contrast. COMPARISON:  None FINDINGS: Brain: Moderate age-related atrophy and chronic microvascular ischemic changes. There is no acute intracranial hemorrhage. No mass effect or midline shift no extra-axial fluid collection. Vascular: No hyperdense vessel or unexpected calcification. Skull: Normal. Negative for fracture or focal lesion. Sinuses/Orbits: There is complete opacification of the right frontal sinus and multiple ethmoid air cells. No air-fluid  level. Small bilateral maxillary sinus retention cysts or polyps noted. The mastoid air cells are clear. Other: None IMPRESSION: 1. No acute intracranial hemorrhage. 2. Age-related atrophy and chronic microvascular ischemic changes. Electronically Signed   By: Anner Crete M.D.   On: 09/22/2019 19:36   MR BRAIN WO CONTRAST  Result Date: 09/22/2019 CLINICAL DATA:  GI bleeding. Anticoagulated. Focal neurological  deficit. Fell. EXAM: MRI HEAD WITHOUT CONTRAST TECHNIQUE: Multiplanar, multiecho pulse sequences of the brain and surrounding structures were obtained without intravenous contrast. COMPARISON:  Head CT same day.  MRI 11/19/2014. FINDINGS: Brain: Diffusion imaging does not show any acute or subacute infarction. No focal finding affects the brainstem or cerebellum. Cerebral hemispheres show pronounced confluent changes of small vessel disease throughout the deep and subcortical white matter. There is age related generalized atrophy. No sign of large vessel infarction. No mass lesion, hemorrhage, hydrocephalus or extra-axial collection. Vascular: Major vessels at the base of the brain show flow. Skull and upper cervical spine: Negative Sinuses/Orbits: Opacification of the right frontal and anterior ethmoid sinuses. Other paranasal sinuses are clear. Orbits are negative. Previous lens implants. Other: Question nasal polyp on the left versus inflamed or engorged turbinates. IMPRESSION: No acute intracranial finding. Age related atrophy. Advanced chronic small-vessel ischemic changes affecting the cerebral hemispheric white matter. Opacified right frontal and anterior ethmoid sinuses. Question left nasal polyp versus engorged turbinate. Electronically Signed   By: Nelson Chimes M.D.   On: 09/22/2019 22:48   CT ABDOMEN PELVIS W CONTRAST  Result Date: 09/22/2019 CLINICAL DATA:  Diverticulitis. EXAM: CT ABDOMEN AND PELVIS WITH CONTRAST TECHNIQUE: Multidetector CT imaging of the abdomen and pelvis was performed using the standard protocol following bolus administration of intravenous contrast. CONTRAST:  41mL OMNIPAQUE IOHEXOL 300 MG/ML  SOLN COMPARISON:  None. FINDINGS: Lower chest: The lung bases are clear. The heart size is normal. Hepatobiliary: The liver is normal. Normal gallbladder.There is no biliary ductal dilation. Pancreas: Normal contours without ductal dilatation. No peripancreatic fluid collection. Spleen: No  splenic laceration or hematoma. Adrenals/Urinary Tract: --Adrenal glands: No adrenal hemorrhage. --Right kidney/ureter: No hydronephrosis or perinephric hematoma. --Left kidney/ureter: No hydronephrosis or perinephric hematoma. --Urinary bladder: Unremarkable. Stomach/Bowel: --Stomach/Duodenum: No hiatal hernia or other gastric abnormality. Normal duodenal course and caliber. --Small bowel: No dilatation or inflammation. --Colon: Rectosigmoid diverticulosis without acute inflammation. --Appendix: Normal. Vascular/Lymphatic: Atherosclerotic calcification is present within the non-aneurysmal abdominal aorta, without hemodynamically significant stenosis. --No retroperitoneal lymphadenopathy. --No mesenteric lymphadenopathy. --No pelvic or inguinal lymphadenopathy. Reproductive: Status post hysterectomy. No adnexal mass. Other: No ascites or free air. There is a small fat containing umbilical hernia. Musculoskeletal. There is a prominent Schmorl's node involving the superior endplate of the L4 vertebral body. Multilevel degenerative changes are noted throughout the visualized thoracolumbar spine without evidence for definite acute displaced fracture. IMPRESSION: Rectosigmoid diverticulosis without acute inflammation. Aortic Atherosclerosis (ICD10-I70.0). Electronically Signed   By: Constance Holster M.D.   On: 09/22/2019 19:15     Subjective: Patient seen and examined.  Today he denies any symptoms.  Was eager to go home.   Discharge Exam: Vitals:   09/28/19 0411 09/28/19 0818  BP: (!) 123/54 133/71  Pulse: 86   Resp: 17 19  Temp: 98 F (36.7 C) 97.9 F (36.6 C)  SpO2: 92% 96%   Vitals:   09/27/19 2200 09/27/19 2210 09/28/19 0411 09/28/19 0818  BP: (!) 152/67  (!) 123/54 133/71  Pulse: 85  86   Resp: 18 (!) 23 17 19   Temp: 98.9 F (37.2  C)  98 F (36.7 C) 97.9 F (36.6 C)  TempSrc: Oral  Oral Oral  SpO2: 93%  92% 96%  Weight:        General: Pt is alert, awake, not in acute  distress Cardiovascular: RRR, S1/S2 +, no rubs, no gallops Respiratory: CTA bilaterally, no wheezing, no rhonchi Abdominal: Soft, NT, ND, bowel sounds + Extremities: no edema, no cyanosis    The results of significant diagnostics from this hospitalization (including imaging, microbiology, ancillary and laboratory) are listed below for reference.     Microbiology: Recent Results (from the past 240 hour(s))  Respiratory Panel by RT PCR (Flu A&B, Covid) -     Status: None   Collection Time: 09/22/19  9:26 PM  Result Value Ref Range Status   SARS Coronavirus 2 by RT PCR NEGATIVE NEGATIVE Final    Comment: (NOTE) SARS-CoV-2 target nucleic acids are NOT DETECTED. The SARS-CoV-2 RNA is generally detectable in upper respiratoy specimens during the acute phase of infection. The lowest concentration of SARS-CoV-2 viral copies this assay can detect is 131 copies/mL. A negative result does not preclude SARS-Cov-2 infection and should not be used as the sole basis for treatment or other patient management decisions. A negative result may occur with  improper specimen collection/handling, submission of specimen other than nasopharyngeal swab, presence of viral mutation(s) within the areas targeted by this assay, and inadequate number of viral copies (<131 copies/mL). A negative result must be combined with clinical observations, patient history, and epidemiological information. The expected result is Negative. Fact Sheet for Patients:  PinkCheek.be Fact Sheet for Healthcare Providers:  GravelBags.it This test is not yet ap proved or cleared by the [place] FDA and  has been authorized for detection and/or diagnosis of SARS-CoV-2 by FDA under an Emergency Use Authorization (EUA). This EUA will remain  in effect (meaning this test can be used) for the duration of the COVID-19 declaration under Section 564(b)(1) of the Act, 21  U.S.C. section 360bbb-3(b)(1), unless the authorization is terminated or revoked sooner.    Influenza A by PCR NEGATIVE NEGATIVE Final   Influenza B by PCR NEGATIVE NEGATIVE Final    Comment: (NOTE) The Xpert Xpress SARS-CoV-2/FLU/RSV assay is intended as an aid in  the diagnosis of influenza from Nasopharyngeal swab specimens and  should not be used as a sole basis for treatment. Nasal washings and  aspirates are unacceptable for Xpert Xpress SARS-CoV-2/FLU/RSV  testing. Fact Sheet for Patients: PinkCheek.be Fact Sheet for Healthcare Providers: GravelBags.it This test is not yet approved or cleared by the [place] FDA and  has been authorized for detection and/or diagnosis of SARS-CoV-2 by  FDA under an Emergency Use Authorization (EUA). This EUA will remain  in effect (meaning this test can be used) for the duration of the  Covid-19 declaration under Section 564(b)(1) of the Act, 21  U.S.C. section 360bbb-3(b)(1), unless the authorization is  terminated or revoked. Performed at [facility], [place] [address], [place], [place] [address]      Labs: BNP (last 3 results) No results for input(s): BNP in the last 8760 hours. Basic Metabolic Panel: Recent Labs  Lab 09/22/19 1532 09/23/19 0402 09/28/19 0338  NA 142 143 142  K 4.0 3.7 2.9*  CL 108 112* 100  CO2 22 25 28   GLUCOSE 139* 105* 99  BUN 23 14 6*  CREATININE 0.82 0.62 0.69  CALCIUM 8.7* 8.0* 8.8*  MG  --   --  1.8  PHOS  --   --  3.4   Liver Function Tests: Recent Labs  Lab 09/22/19 1532 09/28/19 0338  AST 21 14*  ALT 14 11  ALKPHOS 33* 34*  BILITOT 0.5 1.0  PROT 5.5* 5.1*  ALBUMIN 3.2* 2.5*   Recent Labs  Lab 09/22/19 1532  LIPASE 39   No results for input(s): AMMONIA in the last 168 hours. CBC: Recent Labs  Lab 09/22/19 1532 09/23/19 0104 09/23/19 0402 09/23/19 0402 09/23/19 1027 09/23/19 1027 09/23/19 1850 09/23/19 1850  09/23/19 2239 09/23/19 2239 09/24/19 0446 09/24/19 0446 09/24/19 1602 09/25/19 0330 09/26/19 0301 09/27/19 0349 09/28/19 0338  WBC 14.8*   < > 7.7  --  6.1  --  6.9  --  7.3  --  6.9  --   --   --   --   --   --   NEUTROABS 13.9*  --   --   --   --   --   --   --   --   --   --   --   --   --   --   --   --   HGB 10.6*   < > 7.6*   < > 7.4*   < > 10.4*   < > 9.7*   < > 9.4*   < > 10.3* 10.2* 9.5* 9.2* 9.5*  HCT 34.0*   < > 23.7*   < > 23.1*   < > 32.0*   < > 29.3*   < > 28.4*   < > 30.5* 31.6* 28.7* 28.1* 28.8*  MCV 92.6   < > 90.1  --  90.6  --  90.9  --  89.6  --  89.6  --   --   --   --   --   --   PLT 245   < > 184  --  167  --  179  --  180  --  171  --   --   --   --   --   --    < > = values in this interval not displayed.   Cardiac Enzymes: No results for input(s): CKTOTAL, CKMB, CKMBINDEX, TROPONINI in the last 168 hours. BNP: Invalid input(s): POCBNP CBG: No results for input(s): GLUCAP in the last 168 hours. D-Dimer No results for input(s): DDIMER in the last 72 hours. Hgb A1c No results for input(s): HGBA1C in the last 72 hours. Lipid Profile No results for input(s): CHOL, HDL, LDLCALC, TRIG, CHOLHDL, LDLDIRECT in the last 72 hours. Thyroid function studies No results for input(s): TSH, T4TOTAL, T3FREE, THYROIDAB in the last 72 hours.  Invalid input(s): FREET3 Anemia work up No results for input(s): VITAMINB12, FOLATE, FERRITIN, TIBC, IRON, RETICCTPCT in the last 72 hours. Urinalysis    Component Value Date/Time   COLORURINE YELLOW 09/22/2019 2035   APPEARANCEUR CLEAR 09/22/2019 2035   LABSPEC >1.046 (H) 09/22/2019 2035   PHURINE 5.0 09/22/2019 2035   GLUCOSEU NEGATIVE 09/22/2019 2035   HGBUR MODERATE (A) 09/22/2019 2035   BILIRUBINUR NEGATIVE 09/22/2019 2035   KETONESUR 80 (A) 09/22/2019 2035   PROTEINUR NEGATIVE 09/22/2019 2035   NITRITE NEGATIVE 09/22/2019 2035   LEUKOCYTESUR TRACE (A) 09/22/2019 2035   Sepsis Labs Invalid input(s): PROCALCITONIN,   WBC,  LACTICIDVEN Microbiology Recent Results (from the past 240 hour(s))  Respiratory Panel by RT PCR (Flu A&B, Covid) -     Status: None   Collection Time: 09/22/19  9:26 PM  Result Value Ref Range Status  SARS Coronavirus 2 by RT PCR NEGATIVE NEGATIVE Final    Comment: (NOTE) SARS-CoV-2 target nucleic acids are NOT DETECTED. The SARS-CoV-2 RNA is generally detectable in upper respiratoy specimens during the acute phase of infection. The lowest concentration of SARS-CoV-2 viral copies this assay can detect is 131 copies/mL. A negative result does not preclude SARS-Cov-2 infection and should not be used as the sole basis for treatment or other patient management decisions. A negative result may occur with  improper specimen collection/handling, submission of specimen other than nasopharyngeal swab, presence of viral mutation(s) within the areas targeted by this assay, and inadequate number of viral copies (<131 copies/mL). A negative result must be combined with clinical observations, patient history, and epidemiological information. The expected result is Negative. Fact Sheet for Patients:  PinkCheek.be Fact Sheet for Healthcare Providers:  GravelBags.it This test is not yet ap proved or cleared by the [place] FDA and  has been authorized for detection and/or diagnosis of SARS-CoV-2 by FDA under an Emergency Use Authorization (EUA). This EUA will remain  in effect (meaning this test can be used) for the duration of the COVID-19 declaration under Section 564(b)(1) of the Act, 21 U.S.C. section 360bbb-3(b)(1), unless the authorization is terminated or revoked sooner.    Influenza A by PCR NEGATIVE NEGATIVE Final   Influenza B by PCR NEGATIVE NEGATIVE Final    Comment: (NOTE) The Xpert Xpress SARS-CoV-2/FLU/RSV assay is intended as an aid in  the diagnosis of influenza from Nasopharyngeal swab specimens and  should  not be used as a sole basis for treatment. Nasal washings and  aspirates are unacceptable for Xpert Xpress SARS-CoV-2/FLU/RSV  testing. Fact Sheet for Patients: PinkCheek.be Fact Sheet for Healthcare Providers: GravelBags.it This test is not yet approved or cleared by the [place] FDA and  has been authorized for detection and/or diagnosis of SARS-CoV-2 by  FDA under an Emergency Use Authorization (EUA). This EUA will remain  in effect (meaning this test can be used) for the duration of the  Covid-19 declaration under Section 564(b)(1) of the Act, 21  U.S.C. section 360bbb-3(b)(1), unless the authorization is  terminated or revoked. Performed at [facility], [place] [address] [place], [place] [address]      Time coordinating discharge: 35 minutes  SIGNED:   Barb Merino, MD  [facility] 09/28/2019, 1:37 PM

## 2019-10-06 DIAGNOSIS — D649 Anemia, unspecified: Secondary | ICD-10-CM | POA: Diagnosis not present

## 2019-10-06 DIAGNOSIS — K922 Gastrointestinal hemorrhage, unspecified: Secondary | ICD-10-CM | POA: Diagnosis not present

## 2019-10-16 DIAGNOSIS — Z86010 Personal history of colonic polyps: Secondary | ICD-10-CM | POA: Diagnosis not present

## 2019-10-16 DIAGNOSIS — D62 Acute posthemorrhagic anemia: Secondary | ICD-10-CM | POA: Diagnosis not present

## 2019-10-16 DIAGNOSIS — Z8673 Personal history of transient ischemic attack (TIA), and cerebral infarction without residual deficits: Secondary | ICD-10-CM | POA: Diagnosis not present

## 2019-10-16 DIAGNOSIS — K5900 Constipation, unspecified: Secondary | ICD-10-CM | POA: Diagnosis not present

## 2019-10-16 DIAGNOSIS — K922 Gastrointestinal hemorrhage, unspecified: Secondary | ICD-10-CM | POA: Diagnosis not present

## 2019-11-03 DIAGNOSIS — D62 Acute posthemorrhagic anemia: Secondary | ICD-10-CM | POA: Diagnosis not present

## 2020-01-24 DIAGNOSIS — D62 Acute posthemorrhagic anemia: Secondary | ICD-10-CM | POA: Diagnosis not present

## 2020-01-24 DIAGNOSIS — K5900 Constipation, unspecified: Secondary | ICD-10-CM | POA: Diagnosis not present

## 2020-01-24 DIAGNOSIS — Z86010 Personal history of colonic polyps: Secondary | ICD-10-CM | POA: Diagnosis not present

## 2020-01-24 DIAGNOSIS — K922 Gastrointestinal hemorrhage, unspecified: Secondary | ICD-10-CM | POA: Diagnosis not present

## 2020-01-29 DIAGNOSIS — H02834 Dermatochalasis of left upper eyelid: Secondary | ICD-10-CM | POA: Diagnosis not present

## 2020-01-29 DIAGNOSIS — H02831 Dermatochalasis of right upper eyelid: Secondary | ICD-10-CM | POA: Diagnosis not present

## 2020-01-29 DIAGNOSIS — H0288A Meibomian gland dysfunction right eye, upper and lower eyelids: Secondary | ICD-10-CM | POA: Diagnosis not present

## 2020-01-29 DIAGNOSIS — H0100A Unspecified blepharitis right eye, upper and lower eyelids: Secondary | ICD-10-CM | POA: Diagnosis not present

## 2020-01-29 DIAGNOSIS — H0288B Meibomian gland dysfunction left eye, upper and lower eyelids: Secondary | ICD-10-CM | POA: Diagnosis not present

## 2020-01-29 DIAGNOSIS — H0100B Unspecified blepharitis left eye, upper and lower eyelids: Secondary | ICD-10-CM | POA: Diagnosis not present

## 2020-01-29 DIAGNOSIS — H3561 Retinal hemorrhage, right eye: Secondary | ICD-10-CM | POA: Diagnosis not present

## 2020-01-29 DIAGNOSIS — H35373 Puckering of macula, bilateral: Secondary | ICD-10-CM | POA: Diagnosis not present

## 2020-01-29 DIAGNOSIS — H26492 Other secondary cataract, left eye: Secondary | ICD-10-CM | POA: Diagnosis not present

## 2020-03-12 DIAGNOSIS — D509 Iron deficiency anemia, unspecified: Secondary | ICD-10-CM | POA: Diagnosis not present

## 2020-05-13 DIAGNOSIS — H04123 Dry eye syndrome of bilateral lacrimal glands: Secondary | ICD-10-CM | POA: Diagnosis not present

## 2020-05-13 DIAGNOSIS — H3561 Retinal hemorrhage, right eye: Secondary | ICD-10-CM | POA: Diagnosis not present

## 2020-05-16 DIAGNOSIS — Z1231 Encounter for screening mammogram for malignant neoplasm of breast: Secondary | ICD-10-CM | POA: Diagnosis not present

## 2020-06-04 DIAGNOSIS — Z862 Personal history of diseases of the blood and blood-forming organs and certain disorders involving the immune mechanism: Secondary | ICD-10-CM | POA: Diagnosis not present

## 2020-06-04 DIAGNOSIS — Z131 Encounter for screening for diabetes mellitus: Secondary | ICD-10-CM | POA: Diagnosis not present

## 2020-06-04 DIAGNOSIS — K279 Peptic ulcer, site unspecified, unspecified as acute or chronic, without hemorrhage or perforation: Secondary | ICD-10-CM | POA: Diagnosis not present

## 2020-06-04 DIAGNOSIS — M1990 Unspecified osteoarthritis, unspecified site: Secondary | ICD-10-CM | POA: Diagnosis not present

## 2020-06-04 DIAGNOSIS — M810 Age-related osteoporosis without current pathological fracture: Secondary | ICD-10-CM | POA: Diagnosis not present

## 2020-06-04 DIAGNOSIS — Z8673 Personal history of transient ischemic attack (TIA), and cerebral infarction without residual deficits: Secondary | ICD-10-CM | POA: Diagnosis not present

## 2020-06-04 DIAGNOSIS — I700 Atherosclerosis of aorta: Secondary | ICD-10-CM | POA: Diagnosis not present

## 2020-06-04 DIAGNOSIS — M545 Low back pain, unspecified: Secondary | ICD-10-CM | POA: Diagnosis not present

## 2020-06-04 DIAGNOSIS — Z0000 Encounter for general adult medical examination without abnormal findings: Secondary | ICD-10-CM | POA: Diagnosis not present

## 2020-06-04 DIAGNOSIS — Z79899 Other long term (current) drug therapy: Secondary | ICD-10-CM | POA: Diagnosis not present

## 2020-06-04 DIAGNOSIS — E7800 Pure hypercholesterolemia, unspecified: Secondary | ICD-10-CM | POA: Diagnosis not present

## 2020-06-26 DIAGNOSIS — G4700 Insomnia, unspecified: Secondary | ICD-10-CM | POA: Diagnosis not present

## 2020-06-26 DIAGNOSIS — M1990 Unspecified osteoarthritis, unspecified site: Secondary | ICD-10-CM | POA: Diagnosis not present

## 2020-06-26 DIAGNOSIS — M810 Age-related osteoporosis without current pathological fracture: Secondary | ICD-10-CM | POA: Diagnosis not present

## 2020-06-26 DIAGNOSIS — E7800 Pure hypercholesterolemia, unspecified: Secondary | ICD-10-CM | POA: Diagnosis not present

## 2020-08-01 DIAGNOSIS — M810 Age-related osteoporosis without current pathological fracture: Secondary | ICD-10-CM | POA: Diagnosis not present

## 2020-08-01 DIAGNOSIS — G4700 Insomnia, unspecified: Secondary | ICD-10-CM | POA: Diagnosis not present

## 2020-08-01 DIAGNOSIS — M1990 Unspecified osteoarthritis, unspecified site: Secondary | ICD-10-CM | POA: Diagnosis not present

## 2020-08-01 DIAGNOSIS — E7800 Pure hypercholesterolemia, unspecified: Secondary | ICD-10-CM | POA: Diagnosis not present

## 2020-08-24 ENCOUNTER — Observation Stay (HOSPITAL_COMMUNITY): Payer: Medicare HMO

## 2020-08-24 ENCOUNTER — Encounter (HOSPITAL_COMMUNITY): Payer: Self-pay

## 2020-08-24 ENCOUNTER — Observation Stay (HOSPITAL_COMMUNITY)
Admission: EM | Admit: 2020-08-24 | Discharge: 2020-08-26 | Disposition: A | Discharge status: Home / Self Care | Payer: Medicare HMO | Attending: Internal Medicine | Admitting: Internal Medicine

## 2020-08-24 ENCOUNTER — Emergency Department (HOSPITAL_COMMUNITY): Payer: Medicare HMO

## 2020-08-24 ENCOUNTER — Other Ambulatory Visit: Payer: Self-pay

## 2020-08-24 DIAGNOSIS — I6523 Occlusion and stenosis of bilateral carotid arteries: Secondary | ICD-10-CM | POA: Diagnosis not present

## 2020-08-24 DIAGNOSIS — R29810 Facial weakness: Secondary | ICD-10-CM | POA: Diagnosis not present

## 2020-08-24 DIAGNOSIS — R410 Disorientation, unspecified: Secondary | ICD-10-CM | POA: Diagnosis not present

## 2020-08-24 DIAGNOSIS — R4701 Aphasia: Secondary | ICD-10-CM | POA: Diagnosis not present

## 2020-08-24 DIAGNOSIS — H539 Unspecified visual disturbance: Secondary | ICD-10-CM

## 2020-08-24 DIAGNOSIS — J0110 Acute frontal sinusitis, unspecified: Secondary | ICD-10-CM | POA: Diagnosis not present

## 2020-08-24 DIAGNOSIS — J321 Chronic frontal sinusitis: Secondary | ICD-10-CM | POA: Diagnosis not present

## 2020-08-24 DIAGNOSIS — G459 Transient cerebral ischemic attack, unspecified: Secondary | ICD-10-CM

## 2020-08-24 DIAGNOSIS — E785 Hyperlipidemia, unspecified: Secondary | ICD-10-CM

## 2020-08-24 DIAGNOSIS — R9431 Abnormal electrocardiogram [ECG] [EKG]: Secondary | ICD-10-CM | POA: Diagnosis not present

## 2020-08-24 DIAGNOSIS — Z7902 Long term (current) use of antithrombotics/antiplatelets: Secondary | ICD-10-CM | POA: Diagnosis not present

## 2020-08-24 DIAGNOSIS — R569 Unspecified convulsions: Secondary | ICD-10-CM | POA: Diagnosis not present

## 2020-08-24 DIAGNOSIS — E7800 Pure hypercholesterolemia, unspecified: Secondary | ICD-10-CM | POA: Diagnosis not present

## 2020-08-24 DIAGNOSIS — S22080A Wedge compression fracture of T11-T12 vertebra, initial encounter for closed fracture: Secondary | ICD-10-CM | POA: Diagnosis not present

## 2020-08-24 DIAGNOSIS — Z79899 Other long term (current) drug therapy: Secondary | ICD-10-CM | POA: Insufficient documentation

## 2020-08-24 DIAGNOSIS — Z8673 Personal history of transient ischemic attack (TIA), and cerebral infarction without residual deficits: Secondary | ICD-10-CM | POA: Diagnosis not present

## 2020-08-24 DIAGNOSIS — Z23 Encounter for immunization: Secondary | ICD-10-CM | POA: Diagnosis not present

## 2020-08-24 DIAGNOSIS — Z20822 Contact with and (suspected) exposure to covid-19: Secondary | ICD-10-CM | POA: Diagnosis not present

## 2020-08-24 DIAGNOSIS — H53129 Transient visual loss, unspecified eye: Secondary | ICD-10-CM | POA: Insufficient documentation

## 2020-08-24 DIAGNOSIS — R29818 Other symptoms and signs involving the nervous system: Secondary | ICD-10-CM | POA: Diagnosis not present

## 2020-08-24 DIAGNOSIS — E049 Nontoxic goiter, unspecified: Secondary | ICD-10-CM | POA: Diagnosis not present

## 2020-08-24 DIAGNOSIS — Z87891 Personal history of nicotine dependence: Secondary | ICD-10-CM | POA: Diagnosis not present

## 2020-08-24 DIAGNOSIS — R4781 Slurred speech: Secondary | ICD-10-CM | POA: Diagnosis not present

## 2020-08-24 DIAGNOSIS — I10 Essential (primary) hypertension: Secondary | ICD-10-CM | POA: Insufficient documentation

## 2020-08-24 DIAGNOSIS — G9389 Other specified disorders of brain: Secondary | ICD-10-CM | POA: Diagnosis not present

## 2020-08-24 DIAGNOSIS — G43109 Migraine with aura, not intractable, without status migrainosus: Secondary | ICD-10-CM | POA: Diagnosis not present

## 2020-08-24 DIAGNOSIS — I672 Cerebral atherosclerosis: Secondary | ICD-10-CM | POA: Diagnosis not present

## 2020-08-24 DIAGNOSIS — R479 Unspecified speech disturbances: Secondary | ICD-10-CM

## 2020-08-24 DIAGNOSIS — I440 Atrioventricular block, first degree: Secondary | ICD-10-CM | POA: Diagnosis not present

## 2020-08-24 LAB — I-STAT CHEM 8, ED
BUN: 14 mg/dL (ref 8–23)
Calcium, Ion: 1.14 mmol/L — ABNORMAL LOW (ref 1.15–1.40)
Chloride: 99 mmol/L (ref 98–111)
Creatinine, Ser: 0.7 mg/dL (ref 0.44–1.00)
Glucose, Bld: 140 mg/dL — ABNORMAL HIGH (ref 70–99)
HCT: 40 % (ref 36.0–46.0)
Hemoglobin: 13.6 g/dL (ref 12.0–15.0)
Potassium: 3.9 mmol/L (ref 3.5–5.1)
Sodium: 136 mmol/L (ref 135–145)
TCO2: 31 mmol/L (ref 22–32)

## 2020-08-24 LAB — DIFFERENTIAL
Abs Immature Granulocytes: 0.04 10*3/uL (ref 0.00–0.07)
Basophils Absolute: 0 10*3/uL (ref 0.0–0.1)
Basophils Relative: 0 %
Eosinophils Absolute: 0 10*3/uL (ref 0.0–0.5)
Eosinophils Relative: 0 %
Immature Granulocytes: 0 %
Lymphocytes Relative: 7 %
Lymphs Abs: 0.7 10*3/uL (ref 0.7–4.0)
Monocytes Absolute: 0.5 10*3/uL (ref 0.1–1.0)
Monocytes Relative: 5 %
Neutro Abs: 8.3 10*3/uL — ABNORMAL HIGH (ref 1.7–7.7)
Neutrophils Relative %: 88 %

## 2020-08-24 LAB — COMPREHENSIVE METABOLIC PANEL
ALT: 14 U/L (ref 0–44)
AST: 21 U/L (ref 15–41)
Albumin: 3.5 g/dL (ref 3.5–5.0)
Alkaline Phosphatase: 43 U/L (ref 38–126)
Anion gap: 13 (ref 5–15)
BUN: 10 mg/dL (ref 8–23)
CO2: 25 mmol/L (ref 22–32)
Calcium: 9.5 mg/dL (ref 8.9–10.3)
Chloride: 99 mmol/L (ref 98–111)
Creatinine, Ser: 0.78 mg/dL (ref 0.44–1.00)
GFR, Estimated: >60 mL/min (ref >60)
Glucose, Bld: 145 mg/dL — ABNORMAL HIGH (ref 70–99)
Potassium: 3.7 mmol/L (ref 3.5–5.1)
Sodium: 137 mmol/L (ref 135–145)
Total Bilirubin: 0.6 mg/dL (ref 0.3–1.2)
Total Protein: 6 g/dL — ABNORMAL LOW (ref 6.5–8.1)

## 2020-08-24 LAB — CBC
HCT: 41.1 % (ref 36.0–46.0)
Hemoglobin: 13 g/dL (ref 12.0–15.0)
MCH: 29.3 pg (ref 26.0–34.0)
MCHC: 31.6 g/dL (ref 30.0–36.0)
MCV: 92.8 fL (ref 80.0–100.0)
Platelets: 247 10*3/uL (ref 150–400)
RBC: 4.43 MIL/uL (ref 3.87–5.11)
RDW: 13.6 % (ref 11.5–15.5)
WBC: 9.6 10*3/uL (ref 4.0–10.5)
nRBC: 0 % (ref 0.0–0.2)

## 2020-08-24 LAB — APTT: aPTT: 24 seconds (ref 24–36)

## 2020-08-24 LAB — PROTIME-INR
INR: 0.9 (ref 0.8–1.2)
Prothrombin Time: 11.9 seconds (ref 11.4–15.2)

## 2020-08-24 LAB — SARS CORONAVIRUS 2 (TAT 6-24 HRS): SARS Coronavirus 2: NEGATIVE

## 2020-08-24 LAB — CBG MONITORING, ED: Glucose-Capillary: 125 mg/dL — ABNORMAL HIGH (ref 70–99)

## 2020-08-24 MED ORDER — ADULT MULTIVITAMIN W/MINERALS CH
1.0000 | ORAL_TABLET | Freq: Every day | ORAL | Status: DC
  Administered 2020-08-24 – 2020-08-26 (×3): 1 via ORAL
  Filled 2020-08-24 (×3): qty 1

## 2020-08-24 MED ORDER — ACETAMINOPHEN 325 MG PO TABS: 650.0000 mg | ORAL_TABLET | ORAL | Status: DC | PRN

## 2020-08-24 MED ORDER — PRAVASTATIN SODIUM 40 MG PO TABS
20.0000 mg | ORAL_TABLET | Freq: Every day | ORAL | Status: DC
  Administered 2020-08-24 – 2020-08-25 (×2): 20 mg via ORAL
  Filled 2020-08-24: qty 1
  Filled 2020-08-24: qty 2

## 2020-08-24 MED ORDER — SENNOSIDES-DOCUSATE SODIUM 8.6-50 MG PO TABS: 1.0000 | ORAL_TABLET | Freq: Every evening | ORAL | Status: DC | PRN

## 2020-08-24 MED ORDER — CALCIUM CARBONATE-VITAMIN D 500-200 MG-UNIT PO TABS
1.0000 | ORAL_TABLET | Freq: Two times a day (BID) | ORAL | Status: DC
  Administered 2020-08-24 – 2020-08-26 (×4): 1 via ORAL
  Filled 2020-08-24 (×5): qty 1

## 2020-08-24 MED ORDER — BIOTIN 1 MG PO CAPS: 1.0000 mg | ORAL_CAPSULE | Freq: Every day | ORAL | Status: DC

## 2020-08-24 MED ORDER — IOHEXOL 350 MG/ML SOLN
75.0000 mL | Freq: Once | INTRAVENOUS | Status: AC | PRN
  Administered 2020-08-24: 75 mL via INTRAVENOUS

## 2020-08-24 MED ORDER — CLOPIDOGREL BISULFATE 75 MG PO TABS
75.0000 mg | ORAL_TABLET | Freq: Every day | ORAL | Status: DC
  Administered 2020-08-24 – 2020-08-26 (×3): 75 mg via ORAL
  Filled 2020-08-24 (×3): qty 1

## 2020-08-24 MED ORDER — ASCORBIC ACID 500 MG PO TABS
500.0000 mg | ORAL_TABLET | Freq: Every day | ORAL | Status: DC
Start: 2020-08-24 — End: 2020-08-26
  Administered 2020-08-24 – 2020-08-26 (×3): 500 mg via ORAL
  Filled 2020-08-24 (×3): qty 1

## 2020-08-24 MED ORDER — TRIAMCINOLONE ACETONIDE 0.1 % EX CREA
1.0000 "application " | TOPICAL_CREAM | Freq: Every day | CUTANEOUS | Status: DC
  Administered 2020-08-25: 1 via TOPICAL
  Filled 2020-08-24: qty 15

## 2020-08-24 MED ORDER — ACETAMINOPHEN 160 MG/5ML PO SOLN: 650.0000 mg | ORAL | Status: DC | PRN

## 2020-08-24 MED ORDER — STROKE: EARLY STAGES OF RECOVERY BOOK
Freq: Once | Status: AC
  Filled 2020-08-24 (×2): qty 1

## 2020-08-24 MED ORDER — ENOXAPARIN SODIUM 30 MG/0.3ML ~~LOC~~ SOLN
30.0000 mg | SUBCUTANEOUS | Status: DC
  Administered 2020-08-24 – 2020-08-25 (×2): 30 mg via SUBCUTANEOUS
  Filled 2020-08-24 (×2): qty 0.3

## 2020-08-24 MED ORDER — SODIUM CHLORIDE 0.9% FLUSH
3.0000 mL | Freq: Once | INTRAVENOUS | Status: AC
  Administered 2020-08-24: 3 mL via INTRAVENOUS

## 2020-08-24 MED ORDER — ACETAMINOPHEN 650 MG RE SUPP: 650.0000 mg | RECTAL | Status: DC | PRN

## 2020-08-24 MED ORDER — FERROUS SULFATE 325 (65 FE) MG PO TABS
325.0000 mg | ORAL_TABLET | Freq: Every day | ORAL | Status: DC
  Administered 2020-08-24 – 2020-08-26 (×3): 325 mg via ORAL
  Filled 2020-08-24 (×4): qty 1

## 2020-08-24 MED ORDER — INFLUENZA VAC A&B SA ADJ QUAD 0.5 ML IM PRSY
0.5000 mL | PREFILLED_SYRINGE | INTRAMUSCULAR | Status: AC
  Administered 2020-08-26: 0.5 mL via INTRAMUSCULAR
  Filled 2020-08-24 (×2): qty 0.5

## 2020-08-24 NOTE — ED Notes (Signed)
Gave pt snack and water. Tolerating well. Pt asking for dinner tray. Let pt know dinner trays have already bee n passed out for pt's.

## 2020-08-24 NOTE — ED Provider Notes (Signed)
[facility] EMERGENCY DEPARTMENT Provider Note   CSN: [id number] Arrival date & time: 08/24/20  1443     History Chief Complaint  Patient presents with  . Code Stroke    Jill Pitts is a 87 y.o. female.  The history is provided by the patient and medical records. No language interpreter was used.  Neurologic Problem This is a new problem. The current episode started 1 to 2 hours ago. The problem occurs rarely. The problem has been resolved. Pertinent negatives include no chest pain, no abdominal pain, no headaches and no shortness of breath. Nothing aggravates the symptoms. Nothing relieves the symptoms. She has tried nothing for the symptoms. The treatment provided no relief.       Past Medical History:  Diagnosis Date  . Degenerative disc disease, cervical   . Diverticulitis   . Eczema   . Headache 10/25/2014  . Hyperlipidemia   . Osteoarthritis   . Osteoporosis   . Seborrheic dermatitis   . Sinusitis   . Subjective visual disturbance 10/25/2014  . TIA (transient ischemic attack)   . Tinnitus     Patient Active Problem List   Diagnosis Date Noted  . Transient confusion 09/23/2019  . Acute blood loss anemia 09/23/2019  . Acute GI bleeding 09/22/2019  . Cervical spondylosis without myelopathy 04/10/2016  . Subjective visual disturbance 10/25/2014  . Headache 10/25/2014    Past Surgical History:  Procedure Laterality Date  . ABDOMINAL HYSTERECTOMY    . APPENDECTOMY  1981  . BIOPSY  09/25/2019   Procedure: BIOPSY;  Surgeon: Ronald Lobo, MD;  Location: [place];  Service: Endoscopy;;  . CARPAL TUNNEL RELEASE Right 1998  . CATARACT EXTRACTION Bilateral   . COLONOSCOPY WITH PROPOFOL N/A 09/25/2019   Procedure: COLONOSCOPY WITH PROPOFOL;  Surgeon: Ronald Lobo, MD;  Location: [place];  Service: Endoscopy;  Laterality: N/A;  . FLEXIBLE SIGMOIDOSCOPY N/A 09/27/2019   Procedure: FLEXIBLE SIGMOIDOSCOPY;  Surgeon: Ronald Lobo, MD;   Location: [facility] ENDOSCOPY;  Service: Endoscopy;  Laterality: N/A;  . NASAL POLYP SURGERY    . POLYPECTOMY  09/27/2019   Procedure: POLYPECTOMY;  Surgeon: Ronald Lobo, MD;  Location[facility] END[facility];  Service: Endoscopy;;  . SUBMUCOSAL LIFTING INJECTION  09/27/2019   Procedure: SUBMUCOSAL LIFTING INJECTION;  Surgeon: Ronald Lobo, MD;  Location: [place];  Service: Endoscopy;;  . SUBMUCOSAL TATTOO INJECTION  09/27/2019   Procedure: SUBMUCOSAL TATTOO INJECTION;  Surgeon: Ronald Lobo, MD;  Location: [place];  Service: Endoscopy;;  . TONSILLECTOMY AND ADENOIDECTOMY       OB History   No obstetric history on file.     Family History  Problem Relation Age of Onset  . Heart disease Father   . Prostate cancer Father   . Asthma Sister   . Hypertension Sister   . Diabetes Sister   . Stroke Brother     Social History   Tobacco Use  . Smoking status: Former Research scientist (life sciences)  . Smokeless tobacco: Never Used  Substance Use Topics  . Alcohol use: Yes    Alcohol/week: 0.0 standard drinks    Comment: very rare 1-2 glasses wine per year  . Drug use: No    Home Medications Prior to Admission medications   Medication Sig Start Date End Date Taking? Authorizing Provider  Biotin 1 MG CAPS Take 1 mg by mouth daily. 09/28/19   Barb Merino, MD  Calcium Carb-Cholecalciferol (CALCIUM + D3) 600-200 MG-UNIT TABS Take 1 tablet by mouth 2 (two) times daily.  [provider]  clopidogrel (PLAVIX) 75 MG tablet Take 75 mg by mouth daily.    [provider]  Co-Enzyme Q-10 30 MG CAPS Take 30 mg by mouth.    [provider]  Fish Oil OIL Take 1,400 mg by mouth 2 (two) times daily.    [provider]  lovastatin (MEVACOR) 20 MG tablet Take 20 mg by mouth daily. 08/28/14   [provider]  Multiple Vitamins-Minerals (MULTIVITAMIN ADULT PO) Take 1 tablet by mouth daily.    [provider]    Allergies    Gabapentin  Review of Systems   Review  of Systems  Constitutional: Negative for chills, fatigue and fever.  HENT: Negative for congestion.   Eyes: Positive for visual disturbance (resolved). Negative for pain.  Respiratory: Negative for cough, chest tightness, shortness of breath and wheezing.   Cardiovascular: Negative for chest pain and leg swelling.  Gastrointestinal: Negative for abdominal pain, constipation, diarrhea, nausea and vomiting.  Genitourinary: Negative for dysuria.  Musculoskeletal: Negative for back pain, neck pain and neck stiffness.  Neurological: Positive for speech difficulty. Negative for dizziness, weakness, light-headedness and headaches.  Psychiatric/Behavioral: Negative for agitation and confusion.  All other systems reviewed and are negative.   Physical Exam Updated Vital Signs BP (!) 162/69   Pulse 78   Temp 97.9 F (36.6 C) (Oral)   Resp 18   Ht 4\' 10"  (1.473 m)   Wt 40.9 kg   SpO2 95%   BMI 18.85 kg/m   Physical Exam Vitals and nursing note reviewed.  Constitutional:      General: She is not in acute distress.    Appearance: She is well-developed and well-nourished. She is not ill-appearing, toxic-appearing or diaphoretic.  HENT:     Head: Normocephalic and atraumatic.     Nose: No congestion or rhinorrhea.     Mouth/Throat:     Mouth: Mucous membranes are moist.     Pharynx: No oropharyngeal exudate or posterior oropharyngeal erythema.  Eyes:     Extraocular Movements: Extraocular movements intact.     Conjunctiva/sclera: Conjunctivae normal.     Pupils: Pupils are equal, round, and reactive to light.  Cardiovascular:     Rate and Rhythm: Normal rate and regular rhythm.     Pulses: Normal pulses.     Heart sounds: No murmur heard.   Pulmonary:     Effort: Pulmonary effort is normal. No respiratory distress.     Breath sounds: Normal breath sounds. No wheezing, rhonchi or rales.  Chest:     Chest wall: No tenderness.  Abdominal:     General: Abdomen is flat.      Palpations: Abdomen is soft.     Tenderness: There is no abdominal tenderness. There is no left CVA tenderness or guarding.  Musculoskeletal:        General: No tenderness or edema.     Cervical back: Neck supple.  Skin:    General: Skin is warm and dry.     Capillary Refill: Capillary refill takes less than 2 seconds.     Findings: No erythema.  Neurological:     General: No focal deficit present.     Mental Status: She is alert.     Cranial Nerves: No cranial nerve deficit.     Sensory: No sensory deficit.     Motor: No weakness.  Psychiatric:        Mood and Affect: Mood and affect and mood normal.  ED Results / Procedures / Treatments   Labs (all labs ordered are listed, but only abnormal results are displayed) Labs Reviewed  DIFFERENTIAL - Abnormal; Notable for the following components:      Result Value   Neutro Abs 8.3 (*)    All other components within normal limits  COMPREHENSIVE METABOLIC PANEL - Abnormal; Notable for the following components:   Glucose, Bld 145 (*)    Total Protein 6.0 (*)    All other components within normal limits  I-STAT CHEM 8, ED - Abnormal; Notable for the following components:   Glucose, Bld 140 (*)    Calcium, Ion 1.14 (*)    All other components within normal limits  CBG MONITORING, ED - Abnormal; Notable for the following components:   Glucose-Capillary 125 (*)    All other components within normal limits  SARS CORONAVIRUS 2 (TAT 6-24 HRS)  PROTIME-INR  APTT  CBC  HEMOGLOBIN A1C  LIPID PANEL    EKG EKG Interpretation  Date/Time:  Saturday August 24 2020 15:15:30 EST Ventricular Rate:  82 PR Interval:    QRS Duration: 85 QT Interval:  351 QTC Calculation: 410 R Axis:   -8 Text Interpretation: Sinus rhythm Prolonged PR interval No prior ECG for comparison. No STEMI Confirmed by Antony Blackbird [phone]) on 08/24/2020 3:18:13 PM   Radiology CT ANGIO HEAD W OR WO CONTRAST  Result Date: 08/24/2020 CLINICAL DATA:   Expressive aphasia, resolved EXAM: CT ANGIOGRAPHY HEAD AND NECK TECHNIQUE: Multidetector CT imaging of the head and neck was performed using the standard protocol during bolus administration of intravenous contrast. Multiplanar CT image reconstructions and MIPs were obtained to evaluate the vascular anatomy. Carotid stenosis measurements (when applicable) are obtained utilizing NASCET criteria, using the distal internal carotid diameter as the denominator. CONTRAST:  42mL OMNIPAQUE IOHEXOL 350 MG/ML SOLN COMPARISON:  None. FINDINGS: CTA NECK Aortic arch: Minimal calcified plaque along the arch. Great vessel origins are patent. Right carotid system: Patent. Mild calcified plaque at the common carotid bifurcation. No measurable stenosis at the ICA origin. Left carotid system: Patent. No measurable stenosis at the ICA origin. Vertebral arteries: Patent and codominant. Skeleton: Multilevel degenerative changes of the cervical spine. Other neck: Polypoid soft tissue along the nasal septum. Heterogeneous, enlarged thyroid; no further ultrasound follow-up recommended given patient's age. Upper chest: No apical lung mass. Review of the MIP images confirms the above findings CTA HEAD Anterior circulation: Intracranial internal carotid arteries patent with mild calcified plaque. Anterior and middle cerebral arteries are patent. Posterior circulation: Intracranial vertebral arteries are patent with mild calcified plaque. Basilar artery is patent. Posterior cerebral arteries are patent. Small right posterior communicating artery is identified. Venous sinuses: Patent as allowed by contrast bolus timing. Review of the MIP images confirms the above findings IMPRESSION: No large vessel occlusion or hemodynamically significant stenosis. Electronically Signed   By: Macy Mis M.D.   On: 08/24/2020 17:55   DG Chest 2 View  Result Date: 08/24/2020 CLINICAL DATA:  TIA. EXAM: CHEST - 2 VIEW COMPARISON:  November 05, 2016, June 04, 2020 FINDINGS: The cardiomediastinal silhouette is unchanged in contour. No pleural effusion. No pneumothorax. No acute pleuroparenchymal abnormality. Visualized abdomen is unremarkable. Compression fracture deformity at the thoracolumbar junction, increased since October 2021. IMPRESSION: 1. No acute cardiopulmonary abnormality. 2. Compression fracture deformity at the thoracolumbar junction, increased since October 2021. Electronically Signed   By: Valentino Saxon MD   On: 08/24/2020 18:35   CT ANGIO NECK W OR WO CONTRAST  Result Date: 08/24/2020 CLINICAL DATA:  Expressive aphasia, resolved EXAM: CT ANGIOGRAPHY HEAD AND NECK TECHNIQUE: Multidetector CT imaging of the head and neck was performed using the standard protocol during bolus administration of intravenous contrast. Multiplanar CT image reconstructions and MIPs were obtained to evaluate the vascular anatomy. Carotid stenosis measurements (when applicable) are obtained utilizing NASCET criteria, using the distal internal carotid diameter as the denominator. CONTRAST:  34mL OMNIPAQUE IOHEXOL 350 MG/ML SOLN COMPARISON:  None. FINDINGS: CTA NECK Aortic arch: Minimal calcified plaque along the arch. Great vessel origins are patent. Right carotid system: Patent. Mild calcified plaque at the common carotid bifurcation. No measurable stenosis at the ICA origin. Left carotid system: Patent. No measurable stenosis at the ICA origin. Vertebral arteries: Patent and codominant. Skeleton: Multilevel degenerative changes of the cervical spine. Other neck: Polypoid soft tissue along the nasal septum. Heterogeneous, enlarged thyroid; no further ultrasound follow-up recommended given patient's age. Upper chest: No apical lung mass. Review of the MIP images confirms the above findings CTA HEAD Anterior circulation: Intracranial internal carotid arteries patent with mild calcified plaque. Anterior and middle cerebral arteries are patent. Posterior circulation:  Intracranial vertebral arteries are patent with mild calcified plaque. Basilar artery is patent. Posterior cerebral arteries are patent. Small right posterior communicating artery is identified. Venous sinuses: Patent as allowed by contrast bolus timing. Review of the MIP images confirms the above findings IMPRESSION: No large vessel occlusion or hemodynamically significant stenosis. Electronically Signed   By: Macy Mis M.D.   On: 08/24/2020 17:55   MR BRAIN WO CONTRAST  Result Date: 08/24/2020 CLINICAL DATA:  TIA EXAM: MRI HEAD WITHOUT CONTRAST TECHNIQUE: Multiplanar, multiecho pulse sequences of the brain and surrounding structures were obtained without intravenous contrast. COMPARISON:  09/22/2019 FINDINGS: Brain: There is no acute infarction or intracranial hemorrhage. There is no intracranial mass, mass effect, or edema. There is no hydrocephalus or extra-axial fluid collection. Prominence of the ventricles and sulci reflects stable parenchymal volume loss. Patchy and confluent areas of T2 hyperintensity in the supratentorial white matter are nonspecific but probably reflect stable advanced chronic microvascular ischemic changes. Vascular: Major vessel flow voids at the skull base are preserved. Skull and upper cervical spine: Normal marrow signal is preserved. Sinuses/Orbits: Polypoid mucosal thickening. Right frontal sinus and outflow are opacified. There is polypoid tissue along the nasal septum. Appearance is similar to prior study. Bilateral lens replacements. Other: Sella is unremarkable. Mild patchy mastoid fluid opacification. IMPRESSION: No acute infarction, hemorrhage, or mass. Stable advanced chronic microvascular ischemic changes. Similar appearance of probable sinonasal polyposis. Electronically Signed   By: Macy Mis M.D.   On: 08/24/2020 18:20   CT HEAD CODE STROKE WO CONTRAST  Result Date: 08/24/2020 CLINICAL DATA:  Acute neuro deficit, stroke suspected. History of TIA EXAM: CT  HEAD WITHOUT CONTRAST TECHNIQUE: Contiguous axial images were obtained from the base of the skull through the vertex without intravenous contrast. COMPARISON:  09/22/2019 FINDINGS: Brain: There is no acute intracranial hemorrhage, mass effect, or edema. No new loss of gray-white differentiation. Confluent hypoattenuation in the supratentorial white matter is nonspecific but probably reflects stable advanced chronic microvascular ischemic changes. Prominence of the ventricles and sulci reflects stable parenchymal volume loss. No extra-axial collection. Vascular: No hyperdense vessel. There is intracranial atherosclerotic calcification at the skull base. Skull: Unremarkable. Sinuses/Orbits: Opacified right frontal sinus and outflow. No significant orbital finding. Other: Minimal right mastoid opacification. ASPECTS [facility] Stroke Program Early CT Score) - Ganglionic level infarction (caudate, lentiform nuclei, internal capsule, insula,  M1-M3 cortex): 7 - Supraganglionic infarction (M4-M6 cortex): 3 Total score (0-10 with 10 being normal): 10 IMPRESSION: There is no acute intracranial hemorrhage or evidence of acute infarction. ASPECT score is 10. Electronically Signed   By: Macy Mis M.D.   On: 08/24/2020 15:10    Procedures Procedures (including critical care time)  Medications Ordered in ED Medications  pravastatin (PRAVACHOL) tablet 20 mg (20 mg Oral Given 08/24/20 1828)  clopidogrel (PLAVIX) tablet 75 mg (75 mg Oral Given 08/24/20 1829)  ferrous sulfate tablet 325 mg (325 mg Oral Given 08/24/20 1829)  ascorbic acid (VITAMIN C) tablet 500 mg (500 mg Oral Given 08/24/20 1829)  calcium-vitamin D (OSCAL WITH D) 500-200 MG-UNIT per tablet 1 tablet (has no administration in time range)  multivitamin with minerals tablet 1 tablet (1 tablet Oral Given 08/24/20 1836)  triamcinolone (KENALOG) 0.1 % cream 1 application (1 application Topical Not Given 08/24/20 1803)  acetaminophen (TYLENOL) tablet 650 mg (has no  administration in time range)    Or  acetaminophen (TYLENOL) 160 MG/5ML solution 650 mg (has no administration in time range)    Or  acetaminophen (TYLENOL) suppository 650 mg (has no administration in time range)  senna-docusate (Senokot-S) tablet 1 tablet (has no administration in time range)  enoxaparin (LOVENOX) injection 30 mg (30 mg Subcutaneous Given 08/24/20 1830)  sodium chloride flush (NS) 0.9 % injection 3 mL (3 mLs Intravenous Given 08/24/20 1516)   stroke: mapping our early stages of recovery book ( Does not apply Given 08/24/20 1805)  iohexol (OMNIPAQUE) 350 MG/ML injection 75 mL (75 mLs Intravenous Contrast Given 08/24/20 1730)    ED Course  I have reviewed the triage vital signs and the nursing notes.  Pertinent labs & imaging results that were available during my care of the patient were reviewed by me and considered in my medical decision making (see chart for details).    MDM Rules/Calculators/A&P                          SHIQUITA HOUSEHOLDER is a 87 y.o. female with a past medical history significant for prior GI bleeding, osteoporosis, prior TIA, and hyperlipidemia who presents with as a code stroke.  According to EMS, patient's last normal was at 1330.  Patient had sudden onset of garbled speech with aphasia.  Symptoms have reportedly improving but she is very hard of hearing.  EMS also reports that over the last week, patient has had 3 episodes of concerning transient neurologic symptoms including right vision changes several times.   Patient's airway was cleared by team upon arrival and she went to the CT scanner.  I saw patient after she returned from the [place].  Patient says that the right side of her vision intermittently has been blurry several times over the last few days.  She reports that happened last night.  She reports today, she had difficulty word finding and getting out what she wanted to say but she reports it is now back to baseline.  She denies any numbness,  tingling, weakness of extremities.  Denies facial droop.  Denies any vision changes today.  Denies any chest pain, palpitations, fevers, chills, urinary symptoms or GI symptoms.  She otherwise is feeling normal at this time.  Patient had CT head with the code stroke which did not show acute abnormality however, neurology is concerned that this could be recurrent TIAs.  Awaiting official neurology recommendations however I suspect they will  likely want patient to have MRI and get admitted for further management  4:16 PM Neurology finalized their note and they request admission to internal medicine service for EEG, MRI, and further work-up to rule out TIA versus migraine equivalent versus seizure.  Final Clinical Impression(s) / ED Diagnoses Final diagnoses:  Transient speech disturbance  Transient vision disturbance  TIA (transient ischemic attack)    Rx / DC Orders ED Discharge Orders    None      Clinical Impression: 1. Transient speech disturbance   2. Transient vision disturbance   3. TIA (transient ischemic attack)     Disposition: Admit  This note was prepared with assistance of Dragon voice recognition software. Occasional wrong-word or sound-a-like substitutions may have occurred due to the inherent limitations of voice recognition software.     Reesa Gotschall, Gwenyth Allegra, MD 08/24/20 [phone]

## 2020-08-24 NOTE — ED Notes (Signed)
Pt ambulated to the restroom with walker, tolerated well

## 2020-08-24 NOTE — ED Triage Notes (Signed)
Pt from home, LSN 1330. Expressive aphasia and nonsensical questioning. Pt arrives to ed as code stroke, all symptoms have resolved by the time pt arrived to ED. Pt a.o, VSS CBG 170

## 2020-08-24 NOTE — ED Notes (Signed)
Patient transported to CT 

## 2020-08-24 NOTE — Consult Note (Signed)
Stroke Neurology Consultation Note  Consult Requested by: Dr. Gustavus Messing  Reason for Consult: code stroke  Consult Date: 08/24/20   The history was obtained from the pt and her son.  During history and examination, all items were able to obtain unless otherwise noted.  History of Present Illness:  Jill Pitts is a 87 y.o. Caucasian female with PMH of TIA on plavix, GIB, HTN presented to ER for episode of confusion, words finding difficulty and slurry speech.   Per pt and son, around 13:30pm, son heard the patient was hollering and banging on the wall.  He went downstairs and saw her confused, but able to say " I do not know what to do", " where I am going". When answering question, she had difficulty finding words she wanted to say.  EMS was called, on arrival, patient had slurred speech.  Patient denies any headache, weakness or numbness, shaking jerking or loss of consciousness.  She was sent to ER for regulation.  BP 140/80, glucose 117.  At ED arrival, patient symptoms are resolved.  Patient stated that in 70 nineties, she was on hormone replacement therapy, she had episode of right eye blurry vision and the fuzzy vision in the center of the eye.  Years later she had episode of left eye blurry vision fuzzy vision in the center of the eye.  Several years later she had another episode of right eye visual disturbance crossed through the eye.  Earlier last week, patient told son that she had episode of right eye and unable to see right half of the visual field, lasting 20 to 30 minutes.  Last night patient seemed to also had another episode of right eye visual disturbance but not able to elaborate in detail.  She has been told to have TIAs versus ocular migraine, was on Plavix, follow-up with Dr. Jannifer Franklin at [facility] in the past  09/2019 patient had 2 large bloody bowel movement and dizzy with confusion and near syncope.  Diagnosed with acute lower GI bleeding and symptomatic anemia received 1 unit of blood  transfusion, hold off Plavix for 1 week and resumed on 2/17.  Patient stated that she had been on aspirin and Plavix in the past but as it was discontinued due to GI bleeding.  LSN: 1:30 PM tPA Given: No: Symptoms resolved  Past Medical History:  Diagnosis Date  . Degenerative disc disease, cervical   . Diverticulitis   . Eczema   . Headache 10/25/2014  . Hyperlipidemia   . Osteoarthritis   . Osteoporosis   . Seborrheic dermatitis   . Sinusitis   . Subjective visual disturbance 10/25/2014  . TIA (transient ischemic attack)   . Tinnitus     Past Surgical History:  Procedure Laterality Date  . ABDOMINAL HYSTERECTOMY    . APPENDECTOMY  1981  . BIOPSY  09/25/2019   Procedure: BIOPSY;  Surgeon: Ronald Lobo, MD;  Location: [place];  Service: Endoscopy;;  . CARPAL TUNNEL RELEASE Right 1998  . CATARACT EXTRACTION Bilateral   . COLONOSCOPY WITH PROPOFOL N/A 09/25/2019   Procedure: COLONOSCOPY WITH PROPOFOL;  Surgeon: Ronald Lobo, MD;  Location: [place];  Service: Endoscopy;  Laterality: N/A;  . FLEXIBLE SIGMOIDOSCOPY N/A 09/27/2019   Procedure: FLEXIBLE SIGMOIDOSCOPY;  Surgeon: Ronald Lobo, MD;  Location: [facility] ENDOSCOPY;  Service: Endoscopy;  Laterality: N/A;  . NASAL POLYP SURGERY    . POLYPECTOMY  09/27/2019   Procedure: POLYPECTOMY;  Surgeon: Ronald Lobo, MD;  Location: [facility] ENDOSCOPY;  Service: Endoscopy;;  .  SUBMUCOSAL LIFTING INJECTION  09/27/2019   Procedure: SUBMUCOSAL LIFTING INJECTION;  Surgeon: Ronald Lobo, MD;  Location: [place];  Service: Endoscopy;;  . SUBMUCOSAL TATTOO INJECTION  09/27/2019   Procedure: SUBMUCOSAL TATTOO INJECTION;  Surgeon: Ronald Lobo, MD;  Location: [facility] ENDOSCOPY;  Service: Endoscopy;;  . TONSILLECTOMY AND ADENOIDECTOMY      Family History  Problem Relation Age of Onset  . Heart disease Father   . Prostate cancer Father   . Asthma Sister   . Hypertension Sister   . Diabetes Sister   . Stroke Brother     Social  History:  reports that she has quit smoking. She has never used smokeless tobacco. She reports current alcohol use. She reports that she does not use drugs.  Allergies:  Allergies  Allergen Reactions  . Gabapentin     Tachycardia    No current facility-administered medications on file prior to encounter.   Current Outpatient Medications on File Prior to Encounter  Medication Sig Dispense Refill  . Biotin 1 MG CAPS Take 1 mg by mouth daily. 30 capsule   . Calcium Carb-Cholecalciferol (CALCIUM + D3) 600-200 MG-UNIT TABS Take 1 tablet by mouth 2 (two) times daily.    . clopidogrel (PLAVIX) 75 MG tablet Take 75 mg by mouth daily.    Marland Kitchen Co-Enzyme Q-10 30 MG CAPS Take 30 mg by mouth.    . Fish Oil OIL Take 1,400 mg by mouth 2 (two) times daily.    Marland Kitchen lovastatin (MEVACOR) 20 MG tablet Take 20 mg by mouth daily.    . Multiple Vitamins-Minerals (MULTIVITAMIN ADULT PO) Take 1 tablet by mouth daily.    . Ascorbic Acid (VITAMIN C) 500 MG CHEW 1 tablet    . ferrous sulfate 325 (65 FE) MG tablet 1 tablet    . triamcinolone (KENALOG) 0.1 % SMARTSIG:1 Application Topical 2-3 Times Daily      Review of Systems: A full ROS was attempted today and was able to be performed.  Systems assessed include - Constitutional, Eyes, HENT, Respiratory, Cardiovascular, Gastrointestinal, Genitourinary, Integument/breast, Hematologic/lymphatic, Musculoskeletal, Neurological, Behavioral/Psych, Endocrine, Allergic/Immunologic - with pertinent responses as per HPI.  Physical Examination: Temp:  [97.9 F (36.6 C)] 97.9 F (36.6 C) (01/08 1512) Pulse Rate:  [84] 84 (01/08 1530) Resp:  [14-21] 14 (01/08 1530) BP: (169-171)/(74) 171/74 (01/08 1530) SpO2:  [95 %-96 %] 96 % (01/08 1530) Weight:  [40.9 kg] 40.9 kg (01/08 1513)  General - well nourished, well developed, in no apparent distress.    Ophthalmologic - fundi not visualized due to noncooperation.    Cardiovascular - regular rhythm and rate  Mental Status -   Level of arousal and orientation to time, place, and person were intact. Language including expression, naming, repetition, comprehension, reading, and writing was assessed and found intact. Fund of Knowledge was assessed and was intact.  Cranial Nerves II - XII - II - Vision intact OU. III, IV, VI - Extraocular movements intact. V - Facial sensation intact bilaterally. VII - Facial movement intact bilaterally. VIII - Hearing & vestibular intact bilaterally. X - Palate elevates symmetrically. XI - Chin turning & shoulder shrug intact bilaterally. XII - Tongue protrusion intact.  Motor Strength - The patient's strength was normal in all extremities and pronator drift was absent.   Motor Tone & Bulk - Muscle tone was assessed at the neck and appendages and was normal.  Bulk was normal and fasciculations were absent.   Reflexes - The patient's reflexes were normal in all  extremities and she had no pathological reflexes.  Sensory - Light touch, temperature/pinprick were assessed and were normal.    Coordination - The patient had normal movements in the hands and feet with no ataxia or dysmetria.  Tremor was absent.  Gait and Station - deferred  Data Reviewed: CT HEAD CODE STROKE WO CONTRAST  Result Date: 08/24/2020 CLINICAL DATA:  Acute neuro deficit, stroke suspected. History of TIA EXAM: CT HEAD WITHOUT CONTRAST TECHNIQUE: Contiguous axial images were obtained from the base of the skull through the vertex without intravenous contrast. COMPARISON:  09/22/2019 FINDINGS: Brain: There is no acute intracranial hemorrhage, mass effect, or edema. No new loss of gray-white differentiation. Confluent hypoattenuation in the supratentorial white matter is nonspecific but probably reflects stable advanced chronic microvascular ischemic changes. Prominence of the ventricles and sulci reflects stable parenchymal volume loss. No extra-axial collection. Vascular: No hyperdense vessel. There is  intracranial atherosclerotic calcification at the skull base. Skull: Unremarkable. Sinuses/Orbits: Opacified right frontal sinus and outflow. No significant orbital finding. Other: Minimal right mastoid opacification. ASPECTS ([place] Stroke Program Early CT Score) - Ganglionic level infarction (caudate, lentiform nuclei, internal capsule, insula, M1-M3 cortex): 7 - Supraganglionic infarction (M4-M6 cortex): 3 Total score (0-10 with 10 being normal): 10 IMPRESSION: There is no acute intracranial hemorrhage or evidence of acute infarction. ASPECT score is 10. Electronically Signed   By: Macy Mis M.D.   On: 08/24/2020 15:10    Assessment: 87 y.o. female with history of TIA vs. Occular migraine on plavix and statin, GIB 09/2019, HTN presented to ER for episode of confusion, words finding difficulty and slurry speech.  Resolved on arrival.  Time onset 1:30 PM, NIH score 0.  Patient not tPA candidate given resolved symptoms.  Etiology for patient's symptoms not quite clear, could be TIA versus migraine equivalent versus seizure.  Recommend further work-up with CT head and neck, MRI, EEG.  Okay to continue Plavix.  Stroke Risk Factors - hypertension and TIA  Plan: - Admitted to indomethacin further TIA work up  - Frequent neuro checks - Telemetry monitoring - MRI brain  - CTA head and neck - Routine EEG - Echocardiogram  - fasting lipid panel and HgbA1C - Continue home Plavix and statin - Discussed with Dr. Gustavus Messing ED physician - Will follow   Thank you for this consultation and allowing [place] to participate in the care of this patient.  Rosalin Hawking, MD PhD Stroke Neurology 08/24/2020 4:10 PM

## 2020-08-24 NOTE — H&P (Signed)
[facility] History and Physical  Jill Pitts:[id number] DOB: [birth year] DOA: 08/24/2020  PCP: Orpah Melter, MD  Patient coming from: Home  Chief Complaint: Confusion, slurred speech  HPI: Jill Pitts is a 87 y.o. female with a medical history of TIA, hyperlipidemia, hypertension, diverticulitis, who presented to the emergency department with complaints of slurred speech, confusion, and changes in vision which started yesterday evening.  Patient states that she started having symptoms and she decided to go to sleep yesterday evening.  Around 1:30 PM this afternoon, patient's son heard the patient hollering and banging on the wall.  He found his mother confused and stating that "I do not know what to do or where I am going".  Upon arrival to the ED patient's slurred speech and confusion had resolved.  She denies any current chest pain or shortness of breath, abdominal pain, nausea or vomiting, diarrhea or constipation, dizziness or headache, dysuria, recent illness or travel.  At this time, patient denies any further confusion or word finding difficulties or problems with speech or vision changes.  ED Course: CT head was obtained and was unremarkable for stroke.  Neurology was consulted.  TRH called for admission.  Review of Systems:  All other systems reviewed and are negative.   Past Medical History:  Diagnosis Date  . Degenerative disc disease, cervical   . Diverticulitis   . Eczema   . Headache 10/25/2014  . Hyperlipidemia   . Osteoarthritis   . Osteoporosis   . Seborrheic dermatitis   . Sinusitis   . Subjective visual disturbance 10/25/2014  . TIA (transient ischemic attack)   . Tinnitus     Past Surgical History:  Procedure Laterality Date  . ABDOMINAL HYSTERECTOMY    . APPENDECTOMY  1981  . BIOPSY  09/25/2019   Procedure: BIOPSY;  Surgeon: Ronald Lobo, MD;  Location: [place];  Service: Endoscopy;;  . CARPAL TUNNEL RELEASE Right 1998  . CATARACT EXTRACTION  Bilateral   . COLONOSCOPY WITH PROPOFOL N/A 09/25/2019   Procedure: COLONOSCOPY WITH PROPOFOL;  Surgeon: Ronald Lobo, MD;  Location: [place];  Service: Endoscopy;  Laterality: N/A;  . FLEXIBLE SIGMOIDOSCOPY N/A 09/27/2019   Procedure: FLEXIBLE SIGMOIDOSCOPY;  Surgeon: Ronald Lobo, MD;  Location: [facility] ENDOSCOPY;  Service: Endoscopy;  Laterality: N/A;  . NASAL POLYP SURGERY    . POLYPECTOMY  09/27/2019   Procedure: POLYPECTOMY;  Surgeon: Ronald Lobo, MD;  Location: [facility] ENDOSCOPY;  Service: Endoscopy;;  . SUBMUCOSAL LIFTING INJECTION  09/27/2019   Procedure: SUBMUCOSAL LIFTING INJECTION;  Surgeon: Ronald Lobo, MD;  Location: [place];  Service: Endoscopy;;  . SUBMUCOSAL TATTOO INJECTION  09/27/2019   Procedure: SUBMUCOSAL TATTOO INJECTION;  Surgeon: Ronald Lobo, MD;  Location: [place];  Service: Endoscopy;;  . TONSILLECTOMY AND ADENOIDECTOMY      Social History:  reports that she has quit smoking. She has never used smokeless tobacco. She reports current alcohol use. She reports that she does not use drugs.  Allergies  Allergen Reactions  . Gabapentin     Tachycardia    Family History  Problem Relation Age of Onset  . Heart disease Father   . Prostate cancer Father   . Asthma Sister   . Hypertension Sister   . Diabetes Sister   . Stroke Brother      Prior to Admission medications   Medication Sig Start Date End Date Taking? Authorizing Provider  Ascorbic Acid (VITAMIN C) 500 MG CHEW Chew 500 mg by mouth daily.   Yes  [provider]  Biotin 1 MG CAPS Take 1 mg by mouth daily. 09/28/19  Yes Barb Merino, MD  Calcium Carb-Cholecalciferol (CALCIUM + D3) 600-200 MG-UNIT TABS Take 1 tablet by mouth 2 (two) times daily.   Yes [provider]  clopidogrel (PLAVIX) 75 MG tablet Take 75 mg by mouth daily.   Yes [provider]  Co-Enzyme Q-10 30 MG CAPS Take 30 mg by mouth.   Yes [provider]  ferrous sulfate 325 (65 FE) MG  tablet Take 325 mg by mouth daily.   Yes [provider]  Fish Oil OIL Take 1,400 mg by mouth 2 (two) times daily.   Yes [provider]  lovastatin (MEVACOR) 20 MG tablet Take 20 mg by mouth daily. 08/28/14  Yes [provider]  Multiple Vitamins-Minerals (MULTIVITAMIN ADULT PO) Take 1 tablet by mouth daily.   Yes [provider]  triamcinolone (KENALOG) 0.1 % Apply 1 application topically daily. 06/06/20  Yes [provider]    Physical Exam: Vitals:   08/24/20 1600 08/24/20 1630  BP: (!) 162/69 (!) 160/74  Pulse: 78 88  Resp: 18 19  Temp:    SpO2: 95% 96%     General: Well developed, thin, no apparent distress, appears stated age  HEENT: NCAT, PERRLA, EOMI, Anicteic Sclera, mucous membranes moist.   Neck: Supple, no JVD, no masses  Cardiovascular: S1 S2 auscultated, no rubs, murmurs or gallops. Regular rate and rhythm.  Respiratory: Clear to auscultation bilaterally with equal chest rise  Abdomen: Soft, nontender, nondistended, + bowel sounds  Extremities: warm dry without cyanosis clubbing or edema  Neuro: AAOx3, cranial nerves grossly intact. Strength 5/5 in patient's upper and lower extremities bilaterally, gait was normal with the use of a walker  Skin: Without rashes exudates or nodules  Psych: Normal affect and demeanor with intact judgement and insight  Labs on Admission: I have personally reviewed following labs and imaging studies CBC: Recent Labs  Lab 08/24/20 1446 08/24/20 1450  WBC 9.6  --   NEUTROABS 8.3*  --   HGB 13.0 13.6  HCT 41.1 40.0  MCV 92.8  --   PLT 247  --    Basic Metabolic Panel: Recent Labs  Lab 08/24/20 1446 08/24/20 1450  NA 137 136  K 3.7 3.9  CL 99 99  CO2 25  --   GLUCOSE 145* 140*  BUN 10 14  CREATININE 0.78 0.70  CALCIUM 9.5  --    GFR: Estimated Creatinine Clearance: 30.8 mL/min (by C-G formula based on SCr of 0.7 mg/dL). Liver Function Tests: Recent Labs  Lab  08/24/20 1446  AST 21  ALT 14  ALKPHOS 43  BILITOT 0.6  PROT 6.0*  ALBUMIN 3.5   No results for input(s): LIPASE, AMYLASE in the last 168 hours. No results for input(s): AMMONIA in the last 168 hours. Coagulation Profile: Recent Labs  Lab 08/24/20 1446  INR 0.9   Cardiac Enzymes: No results for input(s): CKTOTAL, CKMB, CKMBINDEX, TROPONINI in the last 168 hours. BNP (last 3 results) No results for input(s): PROBNP in the last 8760 hours. HbA1C: No results for input(s): HGBA1C in the last 72 hours. CBG: Recent Labs  Lab 08/24/20 1549  GLUCAP 125*   Lipid Profile: No results for input(s): CHOL, HDL, LDLCALC, TRIG, CHOLHDL, LDLDIRECT in the last 72 hours. Thyroid Function Tests: No results for input(s): TSH, T4TOTAL, FREET4, T3FREE, THYROIDAB in the last 72 hours. Anemia Panel: No results for input(s): VITAMINB12, FOLATE, FERRITIN,  TIBC, IRON, RETICCTPCT in the last 72 hours. Urine analysis:    Component Value Date/Time   COLORURINE YELLOW 09/22/2019 2035   APPEARANCEUR CLEAR 09/22/2019 2035   LABSPEC >1.046 (H) 09/22/2019 2035   PHURINE 5.0 09/22/2019 2035   GLUCOSEU NEGATIVE 09/22/2019 2035   HGBUR MODERATE (A) 09/22/2019 2035   BILIRUBINUR NEGATIVE 09/22/2019 2035   KETONESUR 80 (A) 09/22/2019 2035   PROTEINUR NEGATIVE 09/22/2019 2035   NITRITE NEGATIVE 09/22/2019 2035   LEUKOCYTESUR TRACE (A) 09/22/2019 2035   Sepsis Labs: @LABRCNTIP (procalcitonin:4,lacticidven:4) )No results found for this or any previous visit (from the past 240 hour(s)).   Radiological Exams on Admission: CT HEAD CODE STROKE WO CONTRAST  Result Date: 08/24/2020 CLINICAL DATA:  Acute neuro deficit, stroke suspected. History of TIA EXAM: CT HEAD WITHOUT CONTRAST TECHNIQUE: Contiguous axial images were obtained from the base of the skull through the vertex without intravenous contrast. COMPARISON:  09/22/2019 FINDINGS: Brain: There is no acute intracranial hemorrhage, mass effect, or edema. No  new loss of gray-white differentiation. Confluent hypoattenuation in the supratentorial white matter is nonspecific but probably reflects stable advanced chronic microvascular ischemic changes. Prominence of the ventricles and sulci reflects stable parenchymal volume loss. No extra-axial collection. Vascular: No hyperdense vessel. There is intracranial atherosclerotic calcification at the skull base. Skull: Unremarkable. Sinuses/Orbits: Opacified right frontal sinus and outflow. No significant orbital finding. Other: Minimal right mastoid opacification. ASPECTS ([place] Stroke Program Early CT Score) - Ganglionic level infarction (caudate, lentiform nuclei, internal capsule, insula, M1-M3 cortex): 7 - Supraganglionic infarction (M4-M6 cortex): 3 Total score (0-10 with 10 being normal): 10 IMPRESSION: There is no acute intracranial hemorrhage or evidence of acute infarction. ASPECT score is 10. Electronically Signed   By: Macy Mis M.D.   On: 08/24/2020 15:10    EKG: Independently reviewed.  Sinus rhythm, rate 82  Assessment/Plan  Slurred speech, suspect TIA -Patient presented with symptoms of confusion, word finding difficulties and slurred speech which resolved prior to arrival to the emergency department -She was also noted to have some visual field changes and blurry vision. -CT head showed no evidence of acute infarction -Will obtain MRI brain, CTA head and neck, carotid Doppler, echocardiogram, fasting lipid panel, hemoglobin A1c -Patient is on Plavix at home which we will continue -Continue statin -Neurology consulted and appreciated -Consult speech, PT and OT for evaluations  Essential hypertension -Patient is not on any home medications -Although for now will allow permissive hypertension  Hyperlipidemia -Lipid panel ordered -Continue statin  History of GI bleed -Occurred in February 2021   DVT prophylaxis: Lovenox  Code Status: Full  Family Communication: None at  bedside. Admission, patients condition and plan of care including tests being ordered have been discussed with the patient, who indicates understanding and agrees with the plan and Code Status.  Disposition Plan: Home   Consults called: Neurology   Admission status: Observation   Time spent: 70 minutes  Jessejames Steelman D.O. [facility]  Between 7am to 7pm - Please see pager noted on [email or web address]  After 7pm go to [email or web address] 08/24/2020, 5:06 PM

## 2020-08-25 ENCOUNTER — Encounter (HOSPITAL_COMMUNITY): Payer: Medicare HMO

## 2020-08-25 ENCOUNTER — Observation Stay (HOSPITAL_COMMUNITY): Payer: Medicare HMO

## 2020-08-25 ENCOUNTER — Observation Stay (HOSPITAL_BASED_OUTPATIENT_CLINIC_OR_DEPARTMENT_OTHER): Payer: Medicare HMO

## 2020-08-25 ENCOUNTER — Encounter (HOSPITAL_COMMUNITY): Payer: Self-pay | Admitting: Internal Medicine

## 2020-08-25 DIAGNOSIS — R569 Unspecified convulsions: Secondary | ICD-10-CM | POA: Diagnosis not present

## 2020-08-25 DIAGNOSIS — G43109 Migraine with aura, not intractable, without status migrainosus: Secondary | ICD-10-CM

## 2020-08-25 DIAGNOSIS — R479 Unspecified speech disturbances: Secondary | ICD-10-CM

## 2020-08-25 DIAGNOSIS — E785 Hyperlipidemia, unspecified: Secondary | ICD-10-CM | POA: Diagnosis not present

## 2020-08-25 DIAGNOSIS — E7800 Pure hypercholesterolemia, unspecified: Secondary | ICD-10-CM | POA: Diagnosis not present

## 2020-08-25 DIAGNOSIS — G459 Transient cerebral ischemic attack, unspecified: Secondary | ICD-10-CM

## 2020-08-25 DIAGNOSIS — I10 Essential (primary) hypertension: Secondary | ICD-10-CM | POA: Diagnosis not present

## 2020-08-25 LAB — LIPID PANEL
Cholesterol: 193 mg/dL (ref 0–200)
HDL: 97 mg/dL (ref >40)
LDL Cholesterol: 76 mg/dL (ref 0–99)
Total CHOL/HDL Ratio: 2 RATIO
Triglycerides: 102 mg/dL (ref <150)
VLDL: 20 mg/dL (ref 0–40)

## 2020-08-25 LAB — ECHOCARDIOGRAM COMPLETE
Height: 58 in
S' Lateral: 2.4 cm
Weight: 1442.69 oz

## 2020-08-25 LAB — HEMOGLOBIN A1C
Hgb A1c MFr Bld: 5.1 % (ref 4.8–5.6)
Mean Plasma Glucose: 99.67 mg/dL

## 2020-08-25 MED ORDER — LEVETIRACETAM 500 MG PO TABS
500.0000 mg | ORAL_TABLET | Freq: Two times a day (BID) | ORAL | Status: DC
  Administered 2020-08-26: 500 mg via ORAL
  Filled 2020-08-25: qty 1

## 2020-08-25 MED ORDER — LEVETIRACETAM 750 MG PO TABS
750.0000 mg | ORAL_TABLET | Freq: Once | ORAL | Status: AC
  Administered 2020-08-25: 750 mg via ORAL
  Filled 2020-08-25: qty 1

## 2020-08-25 NOTE — Procedures (Signed)
Patient Name: Jill Pitts  MRN: [id number]  Epilepsy Attending: Lora Havens  Referring Physician/Provider: Dr Cristal Ford Date: 08/25/2020 Duration: 25.32 mins  Patient history: 87 y.o. female with history of TIA vs. Occular migraine on plavix and statin, GIB 09/2019, HTN presented to ER for episode of confusion, words finding difficulty and slurry speech. EEG to evaluate for seizure  Level of alertness: Awake  AEDs during EEG study: None  Technical aspects: This EEG study was done with scalp electrodes positioned according to the 10-20 International system of electrode placement. Electrical activity was acquired at a sampling rate of 500Hz  and reviewed with a high frequency filter of 70Hz  and a low frequency filter of 1Hz . EEG data were recorded continuously and digitally stored.   Description: The posterior dominant rhythm consists of 8 Hz activity of moderate voltage (25-35 uV) seen predominantly in posterior head regions, symmetric and reactive to eye opening and eye closing. EEG showed intermittent 3 to 6 Hz theta-delta slowing in left anterior temporal region which at times appears sharply contoured. Hyperventilation and photic stimulation were not performed.     ABNORMALITY -Intermittent slow, left anterior temporal region  IMPRESSION: This study is suggestive of cortical dysfunction in left anterior temporal region, non specific etiology. No seizures or definite epileptiform discharges were seen throughout the recording.  Eda Magnussen Barbra Sarks

## 2020-08-25 NOTE — Evaluation (Signed)
Speech Language Pathology Evaluation Patient Details Name: Jill Pitts MRN: [id number] DOB: 09-Jan-1929 Today's Date: 08/25/2020 Time: 0973-5329 SLP Time Calculation (min) (ACUTE ONLY): 22 min  Problem List:  Patient Active Problem List   Diagnosis Date Noted  . TIA (transient ischemic attack) 08/24/2020  . Essential hypertension 08/24/2020  . Hyperlipidemia 08/24/2020  . Transient confusion 09/23/2019  . Acute blood loss anemia 09/23/2019  . Acute GI bleeding 09/22/2019  . Cervical spondylosis without myelopathy 04/10/2016  . Subjective visual disturbance 10/25/2014  . Headache 10/25/2014   Past Medical History:  Past Medical History:  Diagnosis Date  . Degenerative disc disease, cervical   . Diverticulitis   . Eczema   . Headache 10/25/2014  . Hyperlipidemia   . Osteoarthritis   . Osteoporosis   . Seborrheic dermatitis   . Sinusitis   . Subjective visual disturbance 10/25/2014  . TIA (transient ischemic attack)   . Tinnitus    Past Surgical History:  Past Surgical History:  Procedure Laterality Date  . ABDOMINAL HYSTERECTOMY    . APPENDECTOMY  1981  . BIOPSY  09/25/2019   Procedure: BIOPSY;  Surgeon: Ronald Lobo, MD;  Location: [place];  Service: Endoscopy;;  . CARPAL TUNNEL RELEASE Right 1998  . CATARACT EXTRACTION Bilateral   . COLONOSCOPY WITH PROPOFOL N/A 09/25/2019   Procedure: COLONOSCOPY WITH PROPOFOL;  Surgeon: Ronald Lobo, MD;  Location: [place];  Service: Endoscopy;  Laterality: N/A;  . FLEXIBLE SIGMOIDOSCOPY N/A 09/27/2019   Procedure: FLEXIBLE SIGMOIDOSCOPY;  Surgeon: Ronald Lobo, MD;  Location: [facility] ENDOSCOPY;  Service: Endoscopy;  Laterality: N/A;  . NASAL POLYP SURGERY    . POLYPECTOMY  09/27/2019   Procedure: POLYPECTOMY;  Surgeon: Ronald Lobo, MD;  Location: [facility] ENDOSCOPY;  Service: Endoscopy;;  . SUBMUCOSAL LIFTING INJECTION  09/27/2019   Procedure: SUBMUCOSAL LIFTING INJECTION;  Surgeon: Ronald Lobo, MD;  Location: [place];   Service: Endoscopy;;  . SUBMUCOSAL TATTOO INJECTION  09/27/2019   Procedure: SUBMUCOSAL TATTOO INJECTION;  Surgeon: Ronald Lobo, MD;  Location: [facility] ENDOSCOPY;  Service: Endoscopy;;  . TONSILLECTOMY AND ADENOIDECTOMY     HPI:  Pt is an 87 y.o. female with a medical hx significant for tinnitus, TIA, subjective visual disturbance, osteoporosis, osteroarthritis, headache, and cervical degenerative disc disease who presents with slurred speech, confusion, and visual changes. CTA and MRI were unremarkable. Imaging did reveal compression fx deformity at the thoracolumbar junction that has increased since October 2021 though. Suspecting TIA.   Assessment / Plan / Recommendation Clinical Impression  Pt scored 17/30 on the SLUMS, suggestive of cognitive impairment that is more than mild. Speech is clear and language is fluent, but she needs frequent repetitions throughout testing and often intermixes pieces of information. She has a hard time remembering details of daily events. She can recall broad acrtivities but again gets the details interchanged. At times during testing it seemed like pt would lose her attention and show reduced effort. Even when brought to her attention, pt would acknowledge this but would not try the activity again. Given current presentation, recommend [facility] SLP and 24/7 supervision upon discharge.    SLP Assessment  SLP Recommendation/Assessment: Patient needs continued Speech Lanaguage Pathology Services SLP Visit Diagnosis: Cognitive communication deficit (R41.841)    Follow Up Recommendations  Home health SLP;24 hour supervision/assistance    Frequency and Duration min 2x/week  2 weeks      SLP Evaluation Cognition  Overall Cognitive Status: Impaired/Different from baseline Arousal/Alertness: Awake/alert Orientation Level: Oriented X4 Attention: Sustained Sustained Attention:  Impaired Sustained Attention Impairment: Verbal basic Memory: Impaired Memory Impairment:  Storage deficit;Decreased recall of new information Awareness: Impaired Awareness Impairment: Intellectual impairment Problem Solving: Impaired Problem Solving Impairment: Verbal basic Safety/Judgment: Impaired       Comprehension  Auditory Comprehension Overall Auditory Comprehension: Impaired Commands: Impaired Complex Commands: 50-74% accurate Conversation: Simple Other Conversation Comments: needs frequent repetitions Interfering Components: Attention;Working memory    Expression Expression Primary Mode of Expression: Verbal Verbal Expression Overall Verbal Expression: Appears within functional limits for tasks assessed   Oral / Motor  Motor Speech Overall Motor Speech: Appears within functional limits for tasks assessed   GO                    Osie Bond., M.A. [facility] Pager [phone] Office [phone]  08/25/2020, 4:25 PM

## 2020-08-25 NOTE — Progress Notes (Signed)
  Echocardiogram 2D Echocardiogram has been performed.  Evangelyne Loja G Marlynn Hinckley 08/25/2020, 12:00 PM

## 2020-08-25 NOTE — Care Management Obs Status (Signed)
[facility] NOTIFICATION   Patient Details  Name: Jill Pitts MRN: [id number] Date of Birth: 18-Jul-1929   Medicare Observation Status Notification Given:  Yes    Pollie Friar, RN 08/25/2020, 1:59 PM

## 2020-08-25 NOTE — Progress Notes (Signed)
EEG complete - results pending 

## 2020-08-25 NOTE — Progress Notes (Signed)
PROGRESS NOTE    Jill Pitts  OZY:[id number] DOB: 1928/09/27 DOA: 08/24/2020 PCP: Joycelyn Rua, MD   Brief Narrative:  HPI on 08/24/2020 Jill Pitts is a 87 y.o. female with a medical history of TIA, hyperlipidemia, hypertension, diverticulitis, who presented to the emergency department with complaints of slurred speech, confusion, and changes in vision which started yesterday evening.  Patient states that she started having symptoms and she decided to go to sleep yesterday evening.  Around 1:30 PM this afternoon, patient's son heard the patient hollering and banging on the wall.  He found his mother confused and stating that "I do not know what to do or where I am going".  Upon arrival to the ED patient's slurred speech and confusion had resolved.  She denies any current chest pain or shortness of breath, abdominal pain, nausea or vomiting, diarrhea or constipation, dizziness or headache, dysuria, recent illness or travel.  At this time, patient denies any further confusion or word finding difficulties or problems with speech or vision changes.  Interim history Admitted for questionable TIA versus complicated migraine.  Neurology following.  Pending EEG today.  Assessment & Plan   Slurred speech, suspect TIA vs complicated migraine -Patient presented with symptoms of confusion, word finding difficulties and slurred speech which resolved prior to arrival to the emergency department -She was also noted to have some visual field changes and blurry vision. -CT head showed no evidence of acute infarction -MRI shows no acute infarction, hemorrhage or mass. -Echocardiogram shows an EF of 60 to 65%, indeterminate diastolic filling due to E-a fusion.  No regional wall motion abnormalities. -CTA head and neck showed no large vessel occlusion or hemodynamically significant stenosis -Hemoglobin A1c 5.6, LDL 76 -Patient states that she had another episode overnight although could not tell me very much  regarding the episode.  States that she had some trouble getting her words out.  Does not know how long the episode lasted. -EEG pending -PT and OT recommending home health -Neurology consulted and appreciated -Continue Plavix and statin  Essential hypertension -Patient is not on any home medications -Although for now will allow permissive hypertension  Hyperlipidemia -Lipid panel ordered -Continue statin  History of GI bleed -Occurred in February 2021  DVT Prophylaxis Lovenox  Code Status: Full  Family Communication: None at bedside.  Disposition Plan:  Status is: Observation  The patient remains OBS appropriate.  Pending EEG.  Will continue to monitor patient for episodes of slurred speech.  Dispo: The patient is from: Home              Anticipated d/c is to: Home              Anticipated d/c date is: 1 day              Patient currently is not medically stable to d/c.   Consultants Neurology  Procedures  Echocardiogram  Antibiotics   Anti-infectives (From admission, onward)   None      Subjective:   Jill Pitts seen and examined today.  States she had a headache this morning which was relieved by Tylenol.  Also states that she had a "crazy episode" last night.  States that she was unable to get her words out.  Cannot recall whether she had any ocular symptoms.  Currently denies chest pain or shortness of breath, abdominal pain, nausea or vomiting, diarrhea or constipation..    Objective:   Vitals:   08/25/20 0216 08/25/20 0800 08/25/20 0901  08/25/20 1340  BP: (!) 175/81 (!) 144/72 (!) 145/75 (!) 152/72  Pulse: 82  91 89  Resp: 18 18 16  (!) 23  Temp: 98 F (36.7 C)  97.9 F (36.6 C) 98 F (36.7 C)  TempSrc: Oral  Oral Oral  SpO2: 100%  94% 95%  Weight:      Height:       No intake or output data in the 24 hours ending 08/25/20 1436 Filed Weights   08/24/20 1400 08/24/20 1513  Weight: 40.9 kg 40.9 kg    Exam  General: Well developed, elderly,  thin, NAD  HEENT: NCAT,  mucous membranes moist.   Cardiovascular: S1 S2 auscultated, RRR  Respiratory: Clear to auscultation bilaterally with equal chest rise  Abdomen: Soft, nontender, nondistended, + bowel sounds  Extremities: warm dry without cyanosis clubbing or edema  Neuro: AAOx3, nonfocal  Psych: pleasant, appropriate mood and affect   Data Reviewed: I have personally reviewed following labs and imaging studies  CBC: Recent Labs  Lab 08/24/20 1446 08/24/20 1450  WBC 9.6  --   NEUTROABS 8.3*  --   HGB 13.0 13.6  HCT 41.1 40.0  MCV 92.8  --   PLT 247  --    Basic Metabolic Panel: Recent Labs  Lab 08/24/20 1446 08/24/20 1450  NA 137 136  K 3.7 3.9  CL 99 99  CO2 25  --   GLUCOSE 145* 140*  BUN 10 14  CREATININE 0.78 0.70  CALCIUM 9.5  --    GFR: Estimated Creatinine Clearance: 30.8 mL/min (by C-G formula based on SCr of 0.7 mg/dL). Liver Function Tests: Recent Labs  Lab 08/24/20 1446  AST 21  ALT 14  ALKPHOS 43  BILITOT 0.6  PROT 6.0*  ALBUMIN 3.5   No results for input(s): LIPASE, AMYLASE in the last 168 hours. No results for input(s): AMMONIA in the last 168 hours. Coagulation Profile: Recent Labs  Lab 08/24/20 1446  INR 0.9   Cardiac Enzymes: No results for input(s): CKTOTAL, CKMB, CKMBINDEX, TROPONINI in the last 168 hours. BNP (last 3 results) No results for input(s): PROBNP in the last 8760 hours. HbA1C: Recent Labs    08/25/20 0219  HGBA1C 5.1   CBG: Recent Labs  Lab 08/24/20 1549  GLUCAP 125*   Lipid Profile: Recent Labs    08/25/20 0219  CHOL 193  HDL 97  LDLCALC 76  TRIG 102  CHOLHDL 2.0   Thyroid Function Tests: No results for input(s): TSH, T4TOTAL, FREET4, T3FREE, THYROIDAB in the last 72 hours. Anemia Panel: No results for input(s): VITAMINB12, FOLATE, FERRITIN, TIBC, IRON, RETICCTPCT in the last 72 hours. Urine analysis:    Component Value Date/Time   COLORURINE YELLOW 09/22/2019 2035    APPEARANCEUR CLEAR 09/22/2019 2035   LABSPEC >1.046 (H) 09/22/2019 2035   PHURINE 5.0 09/22/2019 2035   GLUCOSEU NEGATIVE 09/22/2019 2035   HGBUR MODERATE (A) 09/22/2019 2035   BILIRUBINUR NEGATIVE 09/22/2019 2035   KETONESUR 80 (A) 09/22/2019 2035   PROTEINUR NEGATIVE 09/22/2019 2035   NITRITE NEGATIVE 09/22/2019 2035   LEUKOCYTESUR TRACE (A) 09/22/2019 2035   Sepsis Labs: @LABRCNTIP (procalcitonin:4,lacticidven:4)  ) Recent Results (from the past 240 hour(s))  SARS CORONAVIRUS 2 (TAT 6-24 HRS) Nasopharyngeal Nasopharyngeal Swab     Status: None   Collection Time: 08/24/20  5:03 PM   Specimen: Nasopharyngeal Swab  Result Value Ref Range Status   SARS Coronavirus 2 NEGATIVE NEGATIVE Final    Comment: (NOTE) SARS-CoV-2 target nucleic acids are  NOT DETECTED.  The SARS-CoV-2 RNA is generally detectable in upper and lower respiratory specimens during the acute phase of infection. Negative results do not preclude SARS-CoV-2 infection, do not rule out co-infections with other pathogens, and should not be used as the sole basis for treatment or other patient management decisions. Negative results must be combined with clinical observations, patient history, and epidemiological information. The expected result is Negative.  Fact Sheet for Patients: SugarRoll.be  Fact Sheet for Healthcare Providers: https://www.woods-mathews.com/  This test is not yet approved or cleared by the [place] FDA and  has been authorized for detection and/or diagnosis of SARS-CoV-2 by FDA under an Emergency Use Authorization (EUA). This EUA will remain  in effect (meaning this test can be used) for the duration of the COVID-19 declaration under Se ction 564(b)(1) of the Act, 21 U.S.C. section 360bbb-3(b)(1), unless the authorization is terminated or revoked sooner.  Performed at [facility], [place] [address], [place], [place] [address]        Radiology Studies: CT ANGIO HEAD W OR WO CONTRAST  Result Date: 08/24/2020 CLINICAL DATA:  Expressive aphasia, resolved EXAM: CT ANGIOGRAPHY HEAD AND NECK TECHNIQUE: Multidetector CT imaging of the head and neck was performed using the standard protocol during bolus administration of intravenous contrast. Multiplanar CT image reconstructions and MIPs were obtained to evaluate the vascular anatomy. Carotid stenosis measurements (when applicable) are obtained utilizing NASCET criteria, using the distal internal carotid diameter as the denominator. CONTRAST:  14mL OMNIPAQUE IOHEXOL 350 MG/ML SOLN COMPARISON:  None. FINDINGS: CTA NECK Aortic arch: Minimal calcified plaque along the arch. Great vessel origins are patent. Right carotid system: Patent. Mild calcified plaque at the common carotid bifurcation. No measurable stenosis at the ICA origin. Left carotid system: Patent. No measurable stenosis at the ICA origin. Vertebral arteries: Patent and codominant. Skeleton: Multilevel degenerative changes of the cervical spine. Other neck: Polypoid soft tissue along the nasal septum. Heterogeneous, enlarged thyroid; no further ultrasound follow-up recommended given patient's age. Upper chest: No apical lung mass. Review of the MIP images confirms the above findings CTA HEAD Anterior circulation: Intracranial internal carotid arteries patent with mild calcified plaque. Anterior and middle cerebral arteries are patent. Posterior circulation: Intracranial vertebral arteries are patent with mild calcified plaque. Basilar artery is patent. Posterior cerebral arteries are patent. Small right posterior communicating artery is identified. Venous sinuses: Patent as allowed by contrast bolus timing. Review of the MIP images confirms the above findings IMPRESSION: No large vessel occlusion or hemodynamically significant stenosis. Electronically Signed   By: Macy Mis M.D.   On: 08/24/2020 17:55   DG Chest 2 View  Result  Date: 08/24/2020 CLINICAL DATA:  TIA. EXAM: CHEST - 2 VIEW COMPARISON:  November 05, 2016, June 04, 2020 FINDINGS: The cardiomediastinal silhouette is unchanged in contour. No pleural effusion. No pneumothorax. No acute pleuroparenchymal abnormality. Visualized abdomen is unremarkable. Compression fracture deformity at the thoracolumbar junction, increased since October 2021. IMPRESSION: 1. No acute cardiopulmonary abnormality. 2. Compression fracture deformity at the thoracolumbar junction, increased since October 2021. Electronically Signed   By: Valentino Saxon MD   On: 08/24/2020 18:35   CT ANGIO NECK W OR WO CONTRAST  Result Date: 08/24/2020 CLINICAL DATA:  Expressive aphasia, resolved EXAM: CT ANGIOGRAPHY HEAD AND NECK TECHNIQUE: Multidetector CT imaging of the head and neck was performed using the standard protocol during bolus administration of intravenous contrast. Multiplanar CT image reconstructions and MIPs were obtained to evaluate the vascular anatomy. Carotid stenosis  measurements (when applicable) are obtained utilizing NASCET criteria, using the distal internal carotid diameter as the denominator. CONTRAST:  49mL OMNIPAQUE IOHEXOL 350 MG/ML SOLN COMPARISON:  None. FINDINGS: CTA NECK Aortic arch: Minimal calcified plaque along the arch. Great vessel origins are patent. Right carotid system: Patent. Mild calcified plaque at the common carotid bifurcation. No measurable stenosis at the ICA origin. Left carotid system: Patent. No measurable stenosis at the ICA origin. Vertebral arteries: Patent and codominant. Skeleton: Multilevel degenerative changes of the cervical spine. Other neck: Polypoid soft tissue along the nasal septum. Heterogeneous, enlarged thyroid; no further ultrasound follow-up recommended given patient's age. Upper chest: No apical lung mass. Review of the MIP images confirms the above findings CTA HEAD Anterior circulation: Intracranial internal carotid arteries patent with mild  calcified plaque. Anterior and middle cerebral arteries are patent. Posterior circulation: Intracranial vertebral arteries are patent with mild calcified plaque. Basilar artery is patent. Posterior cerebral arteries are patent. Small right posterior communicating artery is identified. Venous sinuses: Patent as allowed by contrast bolus timing. Review of the MIP images confirms the above findings IMPRESSION: No large vessel occlusion or hemodynamically significant stenosis. Electronically Signed   By: Guadlupe Spanish M.D.   On: 08/24/2020 17:55   MR BRAIN WO CONTRAST  Result Date: 08/24/2020 CLINICAL DATA:  TIA EXAM: MRI HEAD WITHOUT CONTRAST TECHNIQUE: Multiplanar, multiecho pulse sequences of the brain and surrounding structures were obtained without intravenous contrast. COMPARISON:  09/22/2019 FINDINGS: Brain: There is no acute infarction or intracranial hemorrhage. There is no intracranial mass, mass effect, or edema. There is no hydrocephalus or extra-axial fluid collection. Prominence of the ventricles and sulci reflects stable parenchymal volume loss. Patchy and confluent areas of T2 hyperintensity in the supratentorial white matter are nonspecific but probably reflect stable advanced chronic microvascular ischemic changes. Vascular: Major vessel flow voids at the skull base are preserved. Skull and upper cervical spine: Normal marrow signal is preserved. Sinuses/Orbits: Polypoid mucosal thickening. Right frontal sinus and outflow are opacified. There is polypoid tissue along the nasal septum. Appearance is similar to prior study. Bilateral lens replacements. Other: Sella is unremarkable. Mild patchy mastoid fluid opacification. IMPRESSION: No acute infarction, hemorrhage, or mass. Stable advanced chronic microvascular ischemic changes. Similar appearance of probable sinonasal polyposis. Electronically Signed   By: Guadlupe Spanish M.D.   On: 08/24/2020 18:20   ECHOCARDIOGRAM COMPLETE  Result Date:  08/25/2020    ECHOCARDIOGRAM REPORT   Patient Name:   CLAUDEAN LEAVELLE Date of Exam: 08/25/2020 Medical Rec #:  [id number]   Height:       58.0 in Accession #:    [id number]  Weight:       90.2 lb Date of Birth:  [birth year]    BSA:          1.298 m Patient Age:    89 years    BP:           145/75 mmHg Patient Gender: F           HR:           82 bpm. Exam Location:  Inpatient Procedure: 2D Echo, Cardiac Doppler and Color Doppler Indications:    TIA 435.9 / G45.9  History:        Patient has no prior history of Echocardiogram examinations.                 Risk Factors:Dyslipidemia.  Sonographer:    Tiffany Dance Referring Phys: [id number] Winn Muehl IMPRESSIONS  1. Left  ventricular ejection fraction, by estimation, is 60 to 65%. The left ventricle has normal function. The left ventricle has no regional wall motion abnormalities. Indeterminate diastolic filling due to E-A fusion.  2. Right ventricular systolic function is normal. The right ventricular size is normal.  3. The mitral valve is abnormal. Trivial mitral valve regurgitation.  4. The aortic valve is normal in structure. Aortic valve regurgitation is not visualized.  5. The inferior vena cava is normal in size with greater than 50% respiratory variability, suggesting right atrial pressure of 3 mmHg. FINDINGS  Left Ventricle: Left ventricular ejection fraction, by estimation, is 60 to 65%. The left ventricle has normal function. The left ventricle has no regional wall motion abnormalities. The left ventricular internal cavity size was normal in size. There is  no left ventricular hypertrophy. Indeterminate diastolic filling due to E-A fusion. Right Ventricle: The right ventricular size is normal. Right vetricular wall thickness was not assessed. Right ventricular systolic function is normal. Left Atrium: Left atrial size was normal in size. Right Atrium: Right atrial size was normal in size. Pericardium: There is no evidence of pericardial effusion. Mitral Valve: The  mitral valve is abnormal. There is mild thickening of the mitral valve leaflet(s). Trivial mitral valve regurgitation. Tricuspid Valve: The tricuspid valve is normal in structure. Tricuspid valve regurgitation is trivial. Aortic Valve: The aortic valve is normal in structure. Aortic valve regurgitation is not visualized. Pulmonic Valve: The pulmonic valve was normal in structure. Pulmonic valve regurgitation is not visualized. Aorta: The aortic root is normal in size and structure. Venous: The inferior vena cava is normal in size with greater than 50% respiratory variability, suggesting right atrial pressure of 3 mmHg. IAS/Shunts: The interatrial septum was not assessed.  LEFT VENTRICLE PLAX 2D LVIDd:         3.40 cm  Diastology LVIDs:         2.40 cm  LV e' medial:    4.13 cm/s LV PW:         1.00 cm  LV E/e' medial:  1.0 LV IVS:        0.90 cm  LV e' lateral:   5.22 cm/s LVOT diam:     1.90 cm  LV E/e' lateral: 0.8 LV SV:         40 LV SV Index:   31 LVOT Area:     2.84 cm  RIGHT VENTRICLE             IVC RV Basal diam:  2.00 cm     IVC diam: 1.10 cm RV S prime:     11.40 cm/s TAPSE (M-mode): 1.6 cm LEFT ATRIUM             Index       RIGHT ATRIUM          Index LA diam:        2.90 cm 2.23 cm/m  RA Area:     8.71 cm LA Vol (A2C):   22.2 ml 17.10 ml/m RA Volume:   14.00 ml 10.79 ml/m LA Vol (A4C):   16.3 ml 12.56 ml/m LA Biplane Vol: 20.7 ml 15.95 ml/m  AORTIC VALVE LVOT Vmax:   80.65 cm/s LVOT Vmean:  57.400 cm/s LVOT VTI:    0.142 m  AORTA Ao Root diam: 2.90 cm Ao Asc diam:  2.50 cm MV E velocity: 4.24 cm/s MV A velocity: 104.25 cm/s  SHUNTS MV E/A ratio:  0.04  Systemic VTI:  0.14 m                             Systemic Diam: 1.90 cm Dorris Carnes MD Electronically signed by Dorris Carnes MD Signature Date/Time: 08/25/2020/1:52:53 PM    Final    CT HEAD CODE STROKE WO CONTRAST  Result Date: 08/24/2020 CLINICAL DATA:  Acute neuro deficit, stroke suspected. History of TIA EXAM: CT HEAD WITHOUT CONTRAST  TECHNIQUE: Contiguous axial images were obtained from the base of the skull through the vertex without intravenous contrast. COMPARISON:  09/22/2019 FINDINGS: Brain: There is no acute intracranial hemorrhage, mass effect, or edema. No new loss of gray-white differentiation. Confluent hypoattenuation in the supratentorial white matter is nonspecific but probably reflects stable advanced chronic microvascular ischemic changes. Prominence of the ventricles and sulci reflects stable parenchymal volume loss. No extra-axial collection. Vascular: No hyperdense vessel. There is intracranial atherosclerotic calcification at the skull base. Skull: Unremarkable. Sinuses/Orbits: Opacified right frontal sinus and outflow. No significant orbital finding. Other: Minimal right mastoid opacification. ASPECTS ([place] Stroke Program Early CT Score) - Ganglionic level infarction (caudate, lentiform nuclei, internal capsule, insula, M1-M3 cortex): 7 - Supraganglionic infarction (M4-M6 cortex): 3 Total score (0-10 with 10 being normal): 10 IMPRESSION: There is no acute intracranial hemorrhage or evidence of acute infarction. ASPECT score is 10. Electronically Signed   By: Macy Mis M.D.   On: 08/24/2020 15:10     Scheduled Meds: . ascorbic acid  500 mg Oral Daily  . calcium-vitamin D  1 tablet Oral BID  . clopidogrel  75 mg Oral Daily  . enoxaparin (LOVENOX) injection  30 mg Subcutaneous Q24H  . ferrous sulfate  325 mg Oral Daily  . influenza vaccine adjuvanted  0.5 mL Intramuscular Tomorrow-1000  . multivitamin with minerals  1 tablet Oral Daily  . pravastatin  20 mg Oral q1800  . triamcinolone  1 application Topical Daily   Continuous Infusions:   LOS: 0 days   Time Spent in minutes   30 minutes  America Sandall D.O. on 08/25/2020 at 2:36 PM  Between 7am to 7pm - Please see pager noted on [email or web address]  After 7pm go to [email or web address]  And look for the night coverage person covering for me after hours  [facility] Office  [phone]

## 2020-08-25 NOTE — Evaluation (Signed)
Physical Therapy Evaluation Patient Details Name: Jill Pitts MRN: [id number] DOB: 09/25/1928 Today's Date: 08/25/2020   History of Present Illness  Pt is an 87 y.o. female with a medical hx significant for tinnitus, TIA, subjective visual disturbance, osteoporosis, osteroarthritis, headache, and cervical degenerative disc disease who presents with slurred speech, confusion, and visual changes. CTA and MRI were unremarkable. Imaging did reveal compression fx deformity at the thoracolumbar junction that has increased since October 2021 though. Suspecting TIA.  Clinical Impression  Pt presents with condition mentioned above and deficits mentioned below, see PT Problem List. PTA pt lives with her son and his wife in their fully equipped (with kitchen, washer, dryer, etc) basement and is independent with use of a SPC for all functional mobility. Pt appears to have [facility] strength, coordination, and sensation in her legs with formal testing but displays gait deviations and balance deficits that place her at risk for falls. Pt ambulated ~100 ft without an AD and navigated 2 stairs with step-to pattern and use of rails with min guard assist and no overt LOB but mild trunk sway and necessity to react to recover her balance ambulating on even surfaces on occasion. Her DGI of 15 this date further supports she is at risk for falls. Pt's cognition is still decreased compared to her reported baseline as she initially stated her name was "Ramiro Harvest" and would lose attention to task often. Will follow acutely. Recommend follow-up with [facility] PT to maximize her safety and independence with functional mobility. Educated pt and her son on BE FAST and provided them with a handout.  Follow Up Recommendations Home health PT;Supervision - Intermittent    Equipment Recommendations       Recommendations for Other Services       Precautions / Restrictions Precautions Precautions: Fall Restrictions Weight Bearing Restrictions: No       Mobility  Bed Mobility Overal bed mobility: Modified Independent             General bed mobility comments: Pt able to perform all bed mob safely with use of bed controls and rails.    Transfers Overall transfer level: Needs assistance   Transfers: Sit to/from Stand Sit to Stand: Supervision         General transfer comment: Pt able to come to stand without LOB with supervision for safety.  Ambulation/Gait Ambulation/Gait assistance: Min guard Gait Distance (Feet): 100 Feet Assistive device: None Gait Pattern/deviations: Step-through pattern;Decreased step length - right;Decreased step length - left;Decreased stride length;Narrow base of support Gait velocity: decreased Gait velocity interpretation: <1.31 ft/sec, indicative of household ambulator General Gait Details: Ambulates at slow pace with intermittent narrow placement of feet resulting in unsteadiness/trunk sway with pt having to react to maintain balance, min guard for safety. Decreased bilat step length.  Stairs Stairs: Yes Stairs assistance: Min guard Stair Management: Two rails;Step to pattern Number of Stairs: 2 General stair comments: Ascending and descending with step-to pattern and use of rails, no overt LOB but min guard for safety. Increased time and effort descending.  Wheelchair Mobility    Modified Rankin (Stroke Patients Only) Modified Rankin (Stroke Patients Only) Pre-Morbid Rankin Score: No symptoms Modified Rankin: Moderately severe disability     Balance Overall balance assessment: Mild deficits observed, not formally tested                               Standardized Balance Assessment Standardized Balance Assessment : Dynamic  Gait Index   Dynamic Gait Index Level Surface: Mild Impairment Change in Gait Speed: Mild Impairment Gait with Horizontal Head Turns: Mild Impairment Gait with Vertical Head Turns: Mild Impairment Gait and Pivot Turn: Mild  Impairment Step Over Obstacle: Mild Impairment Step Around Obstacles: Mild Impairment Steps: Moderate Impairment Total Score: 15       Pertinent Vitals/Pain Pain Assessment: Faces Faces Pain Scale: Hurts a little bit Pain Location: L face Pain Descriptors / Indicators: Discomfort Pain Intervention(s): Limited activity within patient's tolerance;Monitored during session;Repositioned    Home Living Family/patient expects to be discharged to:: Private residence Living Arrangements: Children (son and his wife) Available Help at Discharge: Available 24 hours/day;Family (both son and his wife have part-time jobs) Type of Home: House Home Access: Level entry (for her level of the home) Entrance Stairs-Rails: Right Entrance Stairs-Number of Steps: 2 Home Layout: Multi-level;Able to live on main level with bedroom/bathroom (she lives in basement that is fully equipped as an apartment with dryer, washer, kitchen, ect "everything" per son) Home Equipment: Cane - single point;Walker - 2 wheels      Prior Function Level of Independence: Independent with assistive device(s)         Comments: Indpendent with all functional mob with use of SPC; drives; manages own medication and finances     Hand Dominance   Dominant Hand: Right    Extremity/Trunk Assessment   Upper Extremity Assessment Upper Extremity Assessment: Defer to OT evaluation    Lower Extremity Assessment Lower Extremity Assessment: Overall WFL for tasks assessed    Cervical / Trunk Assessment Cervical / Trunk Assessment: Normal  Communication   Communication: No difficulties  Cognition Arousal/Alertness: Awake/alert Behavior During Therapy: WFL for tasks assessed/performed Overall Cognitive Status: Impaired/Different from baseline Area of Impairment: Orientation;Attention;Following commands;Memory;Safety/judgement;Awareness;Problem solving                 Orientation Level: Disoriented  to;Person;Time;Situation Current Attention Level: Alternating Memory: Decreased short-term memory Following Commands: Follows one step commands consistently;Follows one step commands with increased time;Follows multi-step commands with increased time Safety/Judgement: Decreased awareness of safety;Decreased awareness of deficits Awareness: Anticipatory Problem Solving: Slow processing;Requires verbal cues General Comments: When asked what her name was she stated "Hillary Tuft" initially. Pt's attention wavered and would intermittently talk as if she were somewhere else. Pt's son states she is normally very "sharp" and seems currently a "tick" off, with pt telling her son that "there were 4 letters up there that have not changed since this morning" pointing to the clock with son appearing confused about what the pt was trying to tell him. Decreased STM as pt would return to bed even after being told she was going to go for a walk.      General Comments General comments (skin integrity, edema, etc.): Pt and pt's son educated on BE FAST with handout provided    Exercises     Assessment/Plan    PT Assessment Patient needs continued PT services  PT Problem List Decreased balance;Decreased mobility;Decreased activity tolerance;Decreased cognition;Decreased knowledge of use of DME;Decreased safety awareness;Decreased knowledge of precautions       PT Treatment Interventions DME instruction;Gait training;Stair training;Functional mobility training;Therapeutic exercise;Therapeutic activities;Balance training;Neuromuscular re-education;Cognitive remediation;Patient/family education    PT Goals (Current goals can be found in the Care Plan section)  Acute Rehab PT Goals Patient Stated Goal: to improve PT Goal Formulation: With patient/family Time For Goal Achievement: 09/08/20 Potential to Achieve Goals: Good    Frequency Min 3X/week   Barriers to  discharge        Co-evaluation                AM-PAC PT "6 Clicks" Mobility  Outcome Measure Help needed turning from your back to your side while in a flat bed without using bedrails?: None Help needed moving from lying on your back to sitting on the side of a flat bed without using bedrails?: None Help needed moving to and from a bed to a chair (including a wheelchair)?: A Little Help needed standing up from a chair using your arms (e.g., wheelchair or bedside chair)?: A Little Help needed to walk in hospital room?: A Little Help needed climbing 3-5 steps with a railing? : A Little 6 Click Score: 20    End of Session Equipment Utilized During Treatment: Gait belt Activity Tolerance: Patient tolerated treatment well Patient left: in chair;with call bell/phone within reach;with chair alarm set;with family/visitor present Nurse Communication: Mobility status PT Visit Diagnosis: Unsteadiness on feet (R26.81);Other abnormalities of gait and mobility (R26.89)    Time: [id number] PT Time Calculation (min) (ACUTE ONLY): 48 min   Charges:   PT Evaluation $PT Eval Low Complexity: 1 Low PT Treatments $Gait Training: 8-22 mins $Therapeutic Activity: 8-22 mins        Moishe Spice, PT, DPT Acute Rehabilitation Services  Pager: [phone] Office: [phone]   Orvan Falconer 08/25/2020, 10:58 AM

## 2020-08-25 NOTE — Progress Notes (Signed)
STROKE TEAM PROGRESS NOTE   INTERVAL HISTORY Her son is at the bedside.  Pt lying in bed, pt and son stated that she had another episode of word finding difficulty overnight, and she was not able to tell her name with PT/OT this am but later she did say her name. Son still feel pt foggy headed this am with confusion which is not herself. Stroke work up so far neg, however, EEG showed significant left temporal slowing.    OBJECTIVE Vitals:   08/24/20 2121 08/25/20 0016 08/25/20 0216 08/25/20 0901  BP: (!) 158/83 (!) 145/77 (!) 175/81 (!) 145/75  Pulse: 87 96 82 91  Resp: 18 16 18 16   Temp: 98.2 F (36.8 C) 98.1 F (36.7 C) 98 F (36.7 C) 97.9 F (36.6 C)  TempSrc: Oral Oral Oral Oral  SpO2: 99% 99% 100% 94%  Weight:      Height:        CBC:  Recent Labs  Lab 08/24/20 1446 08/24/20 1450  WBC 9.6  --   NEUTROABS 8.3*  --   HGB 13.0 13.6  HCT 41.1 40.0  MCV 92.8  --   PLT 247  --     Basic Metabolic Panel:  Recent Labs  Lab 08/24/20 1446 08/24/20 1450  NA 137 136  K 3.7 3.9  CL 99 99  CO2 25  --   GLUCOSE 145* 140*  BUN 10 14  CREATININE 0.78 0.70  CALCIUM 9.5  --     Lipid Panel:     Component Value Date/Time   CHOL 193 08/25/2020 0219   TRIG 102 08/25/2020 0219   HDL 97 08/25/2020 0219   CHOLHDL 2.0 08/25/2020 0219   VLDL 20 08/25/2020 0219   LDLCALC 76 08/25/2020 0219   HgbA1c:  Lab Results  Component Value Date   HGBA1C 5.1 08/25/2020   Urine Drug Screen: No results found for: LABOPIA, COCAINSCRNUR, LABBENZ, AMPHETMU, THCU, LABBARB  Alcohol Level No results found for: ETH  IMAGING  CT ANGIO HEAD W OR WO CONTRAST CT ANGIO NECK W OR WO CONTRAST 08/24/2020 IMPRESSION:  No large vessel occlusion or hemodynamically significant stenosis.   MR BRAIN WO CONTRAST 08/24/2020 IMPRESSION:  No acute infarction, hemorrhage, or mass. Stable advanced chronic microvascular ischemic changes. Similar appearance of probable sinonasal polyposis.   CT HEAD CODE  STROKE WO CONTRAST 08/24/2020 IMPRESSION:  There is no acute intracranial hemorrhage or evidence of acute infarction. ASPECT score is 10.    DG Chest 2 View 08/24/2020 IMPRESSION:  1. No acute cardiopulmonary abnormality.  2. Compression fracture deformity at the thoracolumbar junction, increased since October 2021.   Transthoracic Echocardiogram  00/00/2021 Pending  ECG - SR rate 82 BPM. (See cardiology reading for complete details)  EEG -  ABNORMALITY -Intermittent slow, left anterior temporal region  IMPRESSION: This study is suggestive of cortical dysfunction in left anterior temporal region, non specific etiology. No seizures or definite epileptiform discharges were seen throughout the recording.  PHYSICAL EXAM Blood pressure (!) 145/75, pulse 91, temperature 97.9 F (36.6 C), temperature source Oral, resp. rate 16, height 4\' 10"  (1.473 m), weight 40.9 kg, SpO2 94 %.  General - well nourished, well developed, in no apparent distress.    Ophthalmologic - fundi not visualized due to noncooperation.    Cardiovascular - regular rhythm and rate  Mental Status -  Level of arousal and orientation to time, place, and person were intact. Language including expression, naming, repetition, comprehension, reading, and writing was assessed  and found intact. Fund of Knowledge was assessed and was intact.  Cranial Nerves II - XII - II - Vision intact OU. III, IV, VI - Extraocular movements intact. V - Facial sensation intact bilaterally. VII - Facial movement intact bilaterally. VIII - Hearing & vestibular intact bilaterally. X - Palate elevates symmetrically. XI - Chin turning & shoulder shrug intact bilaterally. XII - Tongue protrusion intact.  Motor Strength - The patient's strength was normal in all extremities and pronator drift was absent.   Motor Tone & Bulk - Muscle tone was assessed at the neck and appendages and was normal.  Bulk was normal and fasciculations  were absent.   Reflexes - The patient's reflexes were normal in all extremities and she had no pathological reflexes.  Sensory - Light touch, temperature/pinprick were assessed and were normal.    Coordination - The patient had normal movements in the hands and feet with no ataxia or dysmetria.  Tremor was absent.  Gait and Station - deferred   ASSESSMENT/PLAN Ms. ILYNN STAUFFER is an 87 y.o. female with a history of TIAs on plavix, GIB, HTN, and Hld who presented to ER for a transient episode of confusion, word finding difficulty and slurred speech.  She did not receive IV t-PA due to resolution of deficits.  Migraine equivalent vs Seizure    CT Head - There is no acute intracranial hemorrhage or evidence of acute infarction. ASPECT score is 10.     CT head - not ordered  MRI head - No acute infarction, hemorrhage, or mass. Stable advanced chronic microvascular ischemic changes  CTA H&N - No large vessel occlusion or hemodynamically significant stenosis.    EEG - significant left temporal slowing, concerning for post ictal, dementia, etc  2D Echo EF 60-65%  [employer] Virus 2 - negative  LDL - 76  HgbA1c - 5.1  VTE prophylaxis - Lovenox  clopidogrel 75 mg daily prior to admission, now on clopidogrel 75 mg daily  Patient will be counseled to be compliant with her antithrombotic medications  Ongoing aggressive stroke risk factor management  Therapy recommendations:  HH PT recommended  Disposition:  Pending  ? Seizure  Pt intermittent episodes of word finding difficulty, confusion  Stroke work up neg  No HA in the past'  EEG significant left temporal slowing, concerning for post ictal, dementia, etc  Will start empiric keppra 500mg  bid  ? Migraine equivalent  No hx of migraine  She did have HA today - will give tylenol  Son had migraine and occular migraine  Symptomatic treatment  Follow up with neurology outpt - see Dr. Jannifer Franklin at [facility] in the  past  Hypertension  Home BP meds: none   Current BP meds: none   Stable . Long-term BP goal normotensive  Hyperlipidemia  Home Lipid lowering medication: Mevacor 20 mg daily  LDL 76, goal < 100  Current lipid lowering medication: Pravastatin 20 mg daily  Continue statin at discharge  Other Stroke Risk Factors  Advanced age  Former cigarette smoker - quit  ETOH use, advised to drink no more than 1 alcoholic beverage per day.  Family hx stroke (brother)   Hx of TIA  Other Active Problems, Findings, Recommendations and/or Plan  Code status - Full code  Hospital day # 0  Rosalin Hawking, MD PhD Stroke Neurology 08/25/2020 5:56 PM   To contact Stroke Continuity provider, please refer to [email or web address] After hours, contact General Neurology

## 2020-08-25 NOTE — Progress Notes (Addendum)
Occupational Therapy Evaluation Patient Details Name: Jill Pitts MRN: [id number] DOB: [birth year] Today's Date: 08/25/2020    History of Present Illness Pt is an 87 y.o. female with a medical hx significant for tinnitus, TIA, subjective visual disturbance, osteoporosis, osteroarthritis, headache, and cervical degenerative disc disease who presents with slurred speech, confusion, and visual changes. CTA and MRI were unremarkable. Imaging did reveal compression fx deformity at the thoracolumbar junction that has increased since October 2021 though. Suspecting TIA.   Clinical Impression   PTA pt lives with family and uses a cane to ambulate. Independent with ADL and IADL tasks, including driving. Pt gave detailed account of what happened yesterday but states she feels much better however she did not sleep last night. Pt appears close to baseline. Scored a 3 on the Short Blessed Test of Memory and Concentration which places her in the "Normal" range, however pt appears confused at times. No apparent visual field cuts and able to read without difficulty. Pt reports that "when it happened I couldn't see the left side of the lady's face on TV". Reports vision has returned to normal. Recommend pt have direct S with all medication and financial management at this time. Will follow acutely. If cognition does not clear, recommend follow up with HHOT.    Follow Up Recommendations  HHOT (S with all medication and financial managment; refrain from driving at this time)    Equipment Recommendations  Tub/shower seat    Recommendations for Other Services Speech consult (further cognitive screening)     Precautions / Restrictions Precautions Precautions: Fall Restrictions Weight Bearing Restrictions: No      Mobility Bed Mobility Overal bed mobility: Modified Independent                  Transfers Overall transfer level: Needs assistance   Transfers: Sit to/from Stand Sit to Stand:  Supervision              Balance Overall balance assessment: No apparent balance deficits (not formally assessed)                                         ADL either performed or assessed with clinical judgement   ADL Overall ADL's : Needs assistance/impaired                                     Functional mobility during ADLs: Supervision/safety General ADL Comments: S for ADL tasks     Vision Baseline Vision/History: Wears glasses Wears Glasses: At all times Additional Comments: Pt reports that her vision was "messed up" yesterday and that she not not see the L side and the "lady's face on TV". Reports vision has returned to normal. No apparent field cuts during confrontation testing. Able toread without difficulty     Perception Perception Perception Tested?: Yes Comments: no apparent deficits   Praxis Praxis Praxis tested?: Within functional limits    Pertinent Vitals/Pain Pain Assessment: Faces Faces Pain Scale: Hurts a little bit Pain Location: L face Pain Descriptors / Indicators: Discomfort Pain Intervention(s): Limited activity within patient's tolerance;Monitored during session;Repositioned     Hand Dominance Right   Extremity/Trunk Assessment Upper Extremity Assessment Upper Extremity Assessment: Overall WFL for tasks assessed   Lower Extremity Assessment Lower Extremity Assessment: Defer to PT evaluation   Cervical /  Trunk Assessment Cervical / Trunk Assessment: Normal   Communication Communication Communication: No difficulties   Cognition Arousal/Alertness: Awake/alert Behavior During Therapy: WFL for tasks assessed/performed Overall Cognitive Status: No family/caregiver present to determine baseline cognitive functioning                                 General Comments: Pt able to give detailed reports of events. Pt is HOH and at times appearsed to have difficulty problem solving, however it could  have been related to her hearing. Scored a 3 on the Short Blessed test, placing her in the "normal" range for cognition.   General Comments  Pt educated on need to refrain from driving at this time and the importance of having direct S wtih medicaiton and Scientist, physiological.    Exercises     Shoulder Instructions      Home Living Family/patient expects to be discharged to:: Private residence Living Arrangements: Children (son and his wife) Available Help at Discharge: Available 24 hours/day;Family (both son and his wife have part-time jobs) Type of Home: House Home Access: Level entry (for her level of the home) Entrance Stairs-Number of Steps: 2 Entrance Stairs-Rails: Right Home Layout: Multi-level;Able to live on main level with bedroom/bathroom (she lives in basement that is fully equipped as an apartment with dryer, washer, kitchen, ect "everything" per son) Alternate Level Stairs-Number of Steps: flight Alternate Level Stairs-Rails: Left (ascending) Bathroom Shower/Tub: Tub/shower unit;Curtain   Bathroom Toilet: Standard     Home Equipment: Cane - single point;Walker - 2 wheels          Prior Functioning/Environment Level of Independence: Independent with assistive device(s)        Comments: Indpendent with all functional mob with use of SPC; drives; manages own medication and finances        OT Problem List: Decreased cognition      OT Treatment/Interventions:      OT Goals(Current goals can be found in the care plan section) Acute Rehab OT Goals Patient Stated Goal: to go home OT Goal Formulation: All assessment and education complete, DC therapy  OT Frequency:     Barriers to D/C:            Co-evaluation              AM-PAC OT "6 Clicks" Daily Activity     Outcome Measure Help from another person eating meals?: None Help from another person taking care of personal grooming?: None Help from another person toileting, which includes using  toliet, bedpan, or urinal?: None Help from another person bathing (including washing, rinsing, drying)?: None Help from another person to put on and taking off regular upper body clothing?: None Help from another person to put on and taking off regular lower body clothing?: None 6 Click Score: 24   End of Session Nurse Communication: Mobility status  Activity Tolerance: Patient tolerated treatment well Patient left: in bed;with call bell/phone within reach;with bed alarm set  OT Visit Diagnosis: Low vision, both eyes (H54.2);Other symptoms and signs involving cognitive function                Time: 5638-7564 OT Time Calculation (min): 30 min Charges:  OT General Charges $OT Visit: 1 Visit OT Evaluation $OT Eval Low Complexity: [place], OT/L   Acute OT Clinical Specialist Acute Rehabilitation Services Pager [phone] Office [phone]   [facility] 08/25/2020, 10:29 AM

## 2020-08-26 ENCOUNTER — Other Ambulatory Visit: Payer: Self-pay | Admitting: Neurology

## 2020-08-26 DIAGNOSIS — I10 Essential (primary) hypertension: Secondary | ICD-10-CM | POA: Diagnosis not present

## 2020-08-26 DIAGNOSIS — G43109 Migraine with aura, not intractable, without status migrainosus: Secondary | ICD-10-CM | POA: Diagnosis not present

## 2020-08-26 DIAGNOSIS — R569 Unspecified convulsions: Secondary | ICD-10-CM

## 2020-08-26 DIAGNOSIS — E785 Hyperlipidemia, unspecified: Secondary | ICD-10-CM | POA: Diagnosis not present

## 2020-08-26 DIAGNOSIS — E7800 Pure hypercholesterolemia, unspecified: Secondary | ICD-10-CM

## 2020-08-26 DIAGNOSIS — G459 Transient cerebral ischemic attack, unspecified: Secondary | ICD-10-CM | POA: Diagnosis not present

## 2020-08-26 DIAGNOSIS — R479 Unspecified speech disturbances: Secondary | ICD-10-CM | POA: Diagnosis not present

## 2020-08-26 LAB — BASIC METABOLIC PANEL
Anion gap: 9 (ref 5–15)
BUN: 13 mg/dL (ref 8–23)
CO2: 27 mmol/L (ref 22–32)
Calcium: 8.9 mg/dL (ref 8.9–10.3)
Chloride: 99 mmol/L (ref 98–111)
Creatinine, Ser: 0.76 mg/dL (ref 0.44–1.00)
GFR, Estimated: >60 mL/min (ref >60)
Glucose, Bld: 105 mg/dL — ABNORMAL HIGH (ref 70–99)
Potassium: 3.6 mmol/L (ref 3.5–5.1)
Sodium: 135 mmol/L (ref 135–145)

## 2020-08-26 LAB — CBC
HCT: 34.8 % — ABNORMAL LOW (ref 36.0–46.0)
Hemoglobin: 11.3 g/dL — ABNORMAL LOW (ref 12.0–15.0)
MCH: 30 pg (ref 26.0–34.0)
MCHC: 32.5 g/dL (ref 30.0–36.0)
MCV: 92.3 fL (ref 80.0–100.0)
Platelets: 236 10*3/uL (ref 150–400)
RBC: 3.77 MIL/uL — ABNORMAL LOW (ref 3.87–5.11)
RDW: 13.4 % (ref 11.5–15.5)
WBC: 6.5 10*3/uL (ref 4.0–10.5)
nRBC: 0 % (ref 0.0–0.2)

## 2020-08-26 MED ORDER — LEVETIRACETAM 250 MG PO TABS: 250.0000 mg | ORAL_TABLET | Freq: Two times a day (BID) | ORAL | Status: DC

## 2020-08-26 MED ORDER — LEVETIRACETAM 250 MG PO TABS: 250.0000 mg | ORAL_TABLET | Freq: Two times a day (BID) | ORAL | 1 refills | Status: DC

## 2020-08-26 NOTE — Progress Notes (Signed)
STROKE TEAM PROGRESS NOTE   INTERVAL HISTORY Her son is at the bedside.  Pt sitting in chair, stated that she felt groggy today but no more episode of speech difficulty or word finding difficulty. Her groggy likely due to keppra effect, will lower dose to 250 bid.   OBJECTIVE Vitals:   08/25/20 1903 08/25/20 2000 08/26/20 0353 08/26/20 1515  BP: 123/62 121/60 131/60 (!) 152/78  Pulse: 84 84  85  Resp: (!) 22 20 20 20   Temp:  98 F (36.7 C) 98 F (36.7 C) 97.6 F (36.4 C)  TempSrc:  Oral Oral Oral  SpO2: 94% 95% 94%   Weight:      Height:        CBC:  Recent Labs  Lab 08/24/20 1446 08/24/20 1450 08/26/20 0043  WBC 9.6  --  6.5  NEUTROABS 8.3*  --   --   HGB 13.0 13.6 11.3*  HCT 41.1 40.0 34.8*  MCV 92.8  --  92.3  PLT 247  --  741    Basic Metabolic Panel:  Recent Labs  Lab 08/24/20 1446 08/24/20 1450 08/26/20 0043  NA 137 136 135  K 3.7 3.9 3.6  CL 99 99 99  CO2 25  --  27  GLUCOSE 145* 140* 105*  BUN 10 14 13   CREATININE 0.78 0.70 0.76  CALCIUM 9.5  --  8.9    Lipid Panel:     Component Value Date/Time   CHOL 193 08/25/2020 0219   TRIG 102 08/25/2020 0219   HDL 97 08/25/2020 0219   CHOLHDL 2.0 08/25/2020 0219   VLDL 20 08/25/2020 0219   LDLCALC 76 08/25/2020 0219   HgbA1c:  Lab Results  Component Value Date   HGBA1C 5.1 08/25/2020   Urine Drug Screen: No results found for: LABOPIA, COCAINSCRNUR, LABBENZ, AMPHETMU, THCU, LABBARB  Alcohol Level No results found for: ETH  IMAGING  CT ANGIO HEAD W OR WO CONTRAST CT ANGIO NECK W OR WO CONTRAST 08/24/2020 IMPRESSION:  No large vessel occlusion or hemodynamically significant stenosis.   MR BRAIN WO CONTRAST 08/24/2020 IMPRESSION:  No acute infarction, hemorrhage, or mass. Stable advanced chronic microvascular ischemic changes. Similar appearance of probable sinonasal polyposis.   CT HEAD CODE STROKE WO CONTRAST 08/24/2020 IMPRESSION:  There is no acute intracranial hemorrhage or evidence of  acute infarction. ASPECT score is 10.    DG Chest 2 View 08/24/2020 IMPRESSION:  1. No acute cardiopulmonary abnormality.  2. Compression fracture deformity at the thoracolumbar junction, increased since October 2021.   ECG - SR rate 82 BPM. (See cardiology reading for complete details)  EEG -  ABNORMALITY -Intermittent slow, left anterior temporal region  IMPRESSION: This study is suggestive of cortical dysfunction in left anterior temporal region, non specific etiology. No seizures or definite epileptiform discharges were seen throughout the recording.  PHYSICAL EXAM Blood pressure (!) 152/78, pulse 85, temperature 97.6 F (36.4 C), temperature source Oral, resp. rate 20, height 4\' 10"  (1.473 m), weight 40.9 kg, SpO2 94 %.  General - well nourished, well developed, in no apparent distress.    Ophthalmologic - fundi not visualized due to noncooperation.    Cardiovascular - regular rhythm and rate  Mental Status -  Level of arousal and orientation to time, place, and person were intact. Language including expression, naming, repetition, comprehension, reading, and writing was assessed and found intact. Fund of Knowledge was assessed and was intact.  Cranial Nerves II - XII - II - Vision intact OU.  III, IV, VI - Extraocular movements intact. V - Facial sensation intact bilaterally. VII - Facial movement intact bilaterally. VIII - Hearing & vestibular intact bilaterally. X - Palate elevates symmetrically. XI - Chin turning & shoulder shrug intact bilaterally. XII - Tongue protrusion intact.  Motor Strength - The patient's strength was normal in all extremities and pronator drift was absent.   Motor Tone & Bulk - Muscle tone was assessed at the neck and appendages and was normal.  Bulk was normal and fasciculations were absent.   Reflexes - The patient's reflexes were normal in all extremities and she had no pathological reflexes.  Sensory - Light touch,  temperature/pinprick were assessed and were normal.    Coordination - The patient had normal movements in the hands and feet with no ataxia or dysmetria.  Tremor was absent.  Gait and Station - deferred   ASSESSMENT/PLAN Ms. ILIANI VEJAR is an 87 y.o. female with a history of TIAs on plavix, GIB, HTN, and Hld who presented to ER for a transient episode of confusion, word finding difficulty and slurred speech.  She did not receive IV t-PA due to resolution of deficits.  Migraine equivalent vs Seizure    CT Head - There is no acute intracranial hemorrhage or evidence of acute infarction. ASPECT score is 10.     CT head - not ordered  MRI head - No acute infarction, hemorrhage, or mass. Stable advanced chronic microvascular ischemic changes  CTA H&N - No large vessel occlusion or hemodynamically significant stenosis.    EEG - significant left temporal slowing, concerning for post ictal, dementia, etc  2D Echo EF 60-65%  [employer] Virus 2 - negative  LDL - 76  HgbA1c - 5.1  VTE prophylaxis - Lovenox  clopidogrel 75 mg daily prior to admission, now on clopidogrel 75 mg daily  Patient will be counseled to be compliant with her antithrombotic medications  Ongoing aggressive stroke risk factor management  Therapy recommendations:  HH PT recommended  Disposition:  Pending  ? Seizure  Pt intermittent episodes of word finding difficulty, confusion  Stroke work up neg  No HA in the past'  EEG significant left temporal slowing, concerning for post ictal, dementia, etc  Loaded with 750mg , will d/c with 250mg  bid given her low weight  Follow up with [facility] Jann[facility] at [facility]  ? Migraine equivalent  No hx of migraine  She did have HA today - will give tylenol  Son had migraine and occular migraine  Symptomatic treatment  Follow up with neurology outpt - see Dr. Jannifer Franklin at [facility] in the past  Hyperlipidemia  Home Lipid lowering medication: Mevacor 20 mg daily  LDL 76,  goal < 100  Current lipid lowering medication: Pravastatin 20 mg daily  Continue statin at discharge  Other Stroke Risk Factors  Advanced age  Former cigarette smoker - quit  ETOH use, advised to drink no more than 1 alcoholic beverage per day.  Family hx stroke (brother)   Hx of TIA  Other Active Problems, Findings, Recommendations and/or Plan  Code status - Full code  Hospital day # 0  Neurology will sign off. Please call with questions. Pt will follow up with Dr. Jannifer Franklin at [facility] in about 4 weeks. Thanks for the consult.   Rosalin Hawking, MD PhD Stroke Neurology 08/26/2020 6:55 PM   To contact Stroke Continuity provider, please refer to [email or web address] After hours, contact General Neurology

## 2020-08-26 NOTE — Progress Notes (Signed)
Patient discharged home with son via wheelchair and volunteer transport at 1545, stable at discharge. Discharge paperwork provided and reviewed with patient.

## 2020-08-26 NOTE — Progress Notes (Signed)
Occupational Therapy Treatment Patient Details Name: Jill Pitts MRN: [id number] DOB: 07-27-1929 Today's Date: 08/26/2020    History of present illness 87 y.o. female with a medical hx significant for tinnitus, TIA, subjective visual disturbance, osteoporosis, osteroarthritis, headache, and cervical degenerative disc disease who presents with slurred speech, confusion, and visual changes. CTA and MRI were unremarkable. Imaging did reveal compression fx deformity at the thoracolumbar junction that has increased since October 2021 though. Suspecting TIA.   OT comments  Administering Pill Box Test for further cognitive assessment and assist in providing more information to family. Pt failed the assessment, demonstrating poor planning, mental flexibility, suboptimal search strategies, concrete thinking and the inability to multitask. Pt had a total of _17_errors, where more than 3 errors is considered a fail. Stopped test at 20 minutes as pt becoming more confused with "one tablet three times daily" and requiring Max cues to problem solve and recognize errors.  Errors: One tablet in the morning (Blue)- _3_ errors (omission/misplacement) One tablet daily at bedtime (orange) - _0_ errors(omission/misplacement) One tablet 3x/day (yellow) - _14_ errors (omission/misplacement)   Total time to complete task (allowed 5 min) - _20_ min  Update dc recommendation to [facility] for further follow up on cognition during IADLs as pt was independent with IADLs prior and is very motivated.    Follow Up Recommendations  Home health OT    Equipment Recommendations  Tub/shower seat    Recommendations for Other Services Speech consult (further cognitive screening)    Precautions / Restrictions Precautions Precautions: Fall       Mobility Bed Mobility               General bed mobility comments: In recliner upon arrival  Transfers                      Balance Overall balance assessment: Mild  deficits observed, not formally tested                                         ADL either performed or assessed with clinical judgement   ADL Overall ADL's : Needs assistance/impaired                                       General ADL Comments: Performing of Pill Box test for cognition     Vision       Perception     Praxis      Cognition Arousal/Alertness: Awake/alert Behavior During Therapy: WFL for tasks assessed/performed Overall Cognitive Status: Impaired/Different from baseline Area of Impairment: Attention;Following commands;Memory;Safety/judgement;Awareness;Problem solving                   Current Attention Level: Alternating Memory: Decreased short-term memory Following Commands: Follows one step commands consistently;Follows one step commands with increased time;Follows multi-step commands with increased time Safety/Judgement: Decreased awareness of safety;Decreased awareness of deficits Awareness: Anticipatory Problem Solving: Slow processing;Requires verbal cues General Comments: Administering Pill Box test to assess executive functioning throughout IADL. Pt performing "one tablet in morning" and "one tablet at bedtime" correctly. Pt then becoming bery confused with "one tablet three times daily." Requiring Max verbal cues to see error and understand instructions. Pt very agreeable to cues and instructions. Stopping test as pt was at 20 min out of 5  minutes. Pt failing Pill Box Test.        Exercises     Shoulder Instructions       General Comments Son present throughout session. Reveiwwed with son and patient on safety for dc to home. Son plans to perform all IADLs and supervise certain ADLs. Reminding to refrain from driving at this time.    Pertinent Vitals/ Pain       Pain Assessment: Faces Faces Pain Scale: No hurt Pain Location: L face Pain Descriptors / Indicators: Discomfort Pain Intervention(s): Monitored  during session;Limited activity within patient's tolerance;Repositioned  Home Living                                          Prior Functioning/Environment              Frequency           Progress Toward Goals  OT Goals(current goals can now be found in the care plan section)  Progress towards OT goals: Progressing toward goals  Acute Rehab OT Goals Patient Stated Goal: to improve OT Goal Formulation: All assessment and education complete, DC therapy ADL Goals Additional ADL Goal #1: Pt will follow 3 step trail making task without difficulty Additional ADL Goal #2: Pt will independently verbalzie 3 strategies to reduce risk of falls  Plan Discharge plan remains appropriate    Co-evaluation                 AM-PAC OT "6 Clicks" Daily Activity     Outcome Measure   Help from another person eating meals?: None Help from another person taking care of personal grooming?: None Help from another person toileting, which includes using toliet, bedpan, or urinal?: None Help from another person bathing (including washing, rinsing, drying)?: None Help from another person to put on and taking off regular upper body clothing?: None Help from another person to put on and taking off regular lower body clothing?: None 6 Click Score: 24    End of Session    OT Visit Diagnosis: Low vision, both eyes (H54.2);Other symptoms and signs involving cognitive function   Activity Tolerance Patient tolerated treatment well   Patient Left in bed;with call bell/phone within reach;with bed alarm set   Nurse Communication Mobility status        Time: 4196-2229 OT Time Calculation (min): 43 min  Charges: OT General Charges $OT Visit: 1 Visit OT Treatments $Self Care/Home Management : 8-22 mins $Cognitive Funtion inital: Initial 15 mins $Cognitive Funtion additional: Additional15 mins  Alayshia Marini MSOT, OTR/L Acute Rehab Pager: [phone] Office:  [place] 08/26/2020, 3:11 PM

## 2020-08-26 NOTE — TOC Transition Note (Signed)
Transition of Care [facility]) - CM/SW Discharge Note   Patient Details  Name: Jill Pitts MRN: [id number] Date of Birth: 11/29/1928  Transition of Care [facility]) CM/SW Contact:  Pollie Friar, RN Phone Number: 08/26/2020, 2:05 PM   Clinical Narrative:    CM met with the patient and her son yesterday. They had no preference for [facility] services. Alvis Lemmings was arranged and Tommi Rumps is aware of d/c home today.  No DME needs. Pt has supervision at home and transportation to home.   Final next level of care: Home w Home Health Services Barriers to Discharge: No Barriers Identified   Patient Goals and CMS Choice   CMS Medicare.gov Compare Post Acute Care list provided to:: Patient Choice offered to / list presented to : Patient  Discharge Placement                       Discharge Plan and Services                          HH Arranged: PT [facility] Agency: [place] Date [place]: 08/26/20   Representative spoke with at [place]: [place] ([place]) Interventions     Readmission Risk Interventions No flowsheet data found.

## 2020-08-26 NOTE — Discharge Summary (Signed)
Physician Discharge Summary  Jill Pitts [id number] DOB: [birth year] DOA: 08/24/2020  PCP: Orpah Melter, MD  Admit date: 08/24/2020 Discharge date: 08/26/2020  Time spent: 45 minutes  Recommendations for Outpatient Follow-up:  Patient will be discharged to home with home health services.  Patient will need to follow up with primary care provider within one week of discharge.  Follow up with neurology. Patient should continue medications as prescribed.  Patient should follow a heart healthy diet.   Discharge Diagnoses:  Slurred speech, suspect TIA vs Migraine equivalent vs seizure  Essential hypertension Hyperlipidemia History of GI bleed  Discharge Condition: Stable  Diet recommendation: Heart healthy  Filed Weights   08/24/20 1400 08/24/20 1513  Weight: 40.9 kg 40.9 kg    History of present illness:  on 08/24/2020 Jill Pitts a 87 y.o.femalewith a medical history ofTIA, hyperlipidemia, hypertension, diverticulitis, who presented to the emergency department with complaints of slurred speech, confusion, and changes in vision which started yesterday evening. Patient states that she started having symptoms and she decided to go to sleep yesterday evening.Around 1:30PMthis afternoon, patient'sson heard the patient hollering and banging on the wall. He found his mother confused and stating that "I do not know what to do or where I am going". Upon arrival to the ED patient's slurred speech and confusion had resolved. She denies any current chest pain or shortness of breath, abdominal pain, nausea or vomiting, diarrhea or constipation, dizziness or headache, dysuria, recent illness or travel. At this time, patient denies any further confusion or word finding difficulties or problems with speech or vision changes.  Hospital Course:  Slurred speech, suspect TIA vs Migraine equivalent vs seizure  -Patient presented with symptoms of confusion, word finding difficulties and  slurred speech which resolved prior to arrival to the emergency department -She was also noted to have some visual field changes and blurry vision. -CT head showed no evidence of acute infarction -MRI shows no acute infarction, hemorrhage or mass. -Echocardiogram shows an EF of 60 to 65%, indeterminate diastolic filling due to E-a fusion.  No regional wall motion abnormalities. -CTA head and neck showed no large vessel occlusion or hemodynamically significant stenosis -Hemoglobin A1c 5.6, LDL 76 -Patient states that she had another episode overnight although could not tell me very much regarding the episode.  States that she had some trouble getting her words out.  Does not know how long the episode lasted. -EEG: cortical dysfunction in L anterior temporal region, nonspecific etiology. No seizures throughout the recording. -PT and OT recommending home health -Neurology consulted and appreciated. Discussed with neurology, Dr. Erlinda Hong. Patient started on Keppra- given dose of 750mg  on 1/9. Will discharge with Keppra 250mg  BID. Patient will need to follow up with neurology.  -Continue Plavix and statin  Essential hypertension -Patient is not on any home medications -Although for now will allow permissive hypertension  Hyperlipidemia -Lipid panel ordered -Continue statin  History of GI bleed -Occurred in February 2021  Consultants Neurology  Procedures  Echocardiogram EEG  Discharge Exam: Vitals:   08/25/20 2000 08/26/20 0353  BP: 121/60 131/60  Pulse: 84   Resp: 20 20  Temp: 98 F (36.7 C) 98 F (36.7 C)  SpO2: 95% 94%     General: Well developed, thin, elderly, NAD  HEENT: NCAT, mucous membranes moist.  Cardiovascular: S1 S2 auscultated, RRR.  Respiratory: Clear to auscultation bilaterally, no wheezing  Abdomen: Soft, nontender, nondistended, + bowel sounds  Extremities: warm dry without cyanosis clubbing or edema  Neuro: AAOx3, nonfocal  Psych: Appropriate mood  and affect  Discharge Instructions Discharge Instructions    Diet - low sodium heart healthy   Complete by: As directed    Discharge instructions   Complete by: As directed    Patient will be discharged to home with home health services.  Patient will need to follow up with primary care provider within one week of discharge.  Follow up with neurology. Patient should continue medications as prescribed.  Patient should follow a heart healthy diet.   Per [facility] statutes, patients with seizures are not allowed to drive until they have been seizure-free for six months. Use caution when using heavy equipment or power tools. Avoid working on ladders or at heights. Take showers instead of baths. Ensure the water temperature is not too high on the home water heater. Do not go swimming alone. When caring for infants or small children, sit down when holding, feeding, or changing them to minimize risk of injury to the child in the event you have a seizure.   Increase activity slowly   Complete by: As directed      Allergies as of 08/26/2020      Reactions   Gabapentin    Tachycardia      Medication List    TAKE these medications   Biotin 1 MG Caps Take 1 mg by mouth daily.   Calcium + D3 600-200 MG-UNIT Tabs Take 1 tablet by mouth 2 (two) times daily.   clopidogrel 75 MG tablet Commonly known as: PLAVIX Take 75 mg by mouth daily.   Co-Enzyme Q-10 30 MG Caps Take 30 mg by mouth.   ferrous sulfate 325 (65 FE) MG tablet Take 325 mg by mouth daily.   Fish Oil Oil Take 1,400 mg by mouth 2 (two) times daily.   levETIRAcetam 250 MG tablet Commonly known as: KEPPRA Take 1 tablet (250 mg total) by mouth 2 (two) times daily.   lovastatin 20 MG tablet Commonly known as: MEVACOR Take 20 mg by mouth daily.   MULTIVITAMIN ADULT PO Take 1 tablet by mouth daily.   triamcinolone 0.1 % Commonly known as: KENALOG Apply 1 application topically daily.   Vitamin C 500 MG Chew Chew  500 mg by mouth daily.      Allergies  Allergen Reactions  . Gabapentin     Tachycardia    Follow-up Information    [facility] Follow [facility].   Specialty: Home Health Services Why: The home health agency will contact you for the first home visit. Contact information: [place] [address] [phone]        Orpah Melter, MD. Schedule an appointment as soon as possible for a visit in 1 week(s).   Specialty: Family Medicine Why: Hospital follow up Contact information: [address] [place] [address] [phone]        Kathrynn Ducking, MD Follow up.   Specialty: Neurology Contact information: [address] [place] [address] [phone]                The results of significant diagnostics from this hospitalization (including imaging, microbiology, ancillary and laboratory) are listed below for reference.    Significant Diagnostic Studies: EEG  Result Date: 08/25/2020 Lora Havens, MD     08/25/2020  3:57 PM Patient Name: SALETA MENCIA MRN: [id number] Epilepsy Attending: Lora Havens Referring Physician/Provider: Dr Cristal Ford Date: 08/25/2020 Duration:  25.32 mins Patient history: 87 y.o. female with history of TIA vs. Occular migraine on plavix and statin, GIB 09/2019, HTN presented to ER for episode of confusion, words finding difficulty and slurry speech. EEG to evaluate for seizure Level of alertness: Awake AEDs during EEG study: None Technical aspects: This EEG study was done with scalp electrodes positioned according to the 10-20 International system of electrode placement. Electrical activity was acquired at a sampling rate of 500Hz  and reviewed with a high frequency filter of 70Hz  and a low frequency filter of 1Hz . EEG data were recorded continuously and digitally stored. Description: The posterior dominant rhythm consists of 8 Hz activity of moderate voltage (25-35 uV) seen  predominantly in posterior head regions, symmetric and reactive to eye opening and eye closing. EEG showed intermittent 3 to 6 Hz theta-delta slowing in left anterior temporal region which at times appears sharply contoured. Hyperventilation and photic stimulation were not performed.   ABNORMALITY -Intermittent slow, left anterior temporal region IMPRESSION: This study is suggestive of cortical dysfunction in left anterior temporal region, non specific etiology. No seizures or definite epileptiform discharges were seen throughout the recording. Priyanka Barbra Sarks   CT ANGIO HEAD W OR WO CONTRAST  Result Date: 08/24/2020 CLINICAL DATA:  Expressive aphasia, resolved EXAM: CT ANGIOGRAPHY HEAD AND NECK TECHNIQUE: Multidetector CT imaging of the head and neck was performed using the standard protocol during bolus administration of intravenous contrast. Multiplanar CT image reconstructions and MIPs were obtained to evaluate the vascular anatomy. Carotid stenosis measurements (when applicable) are obtained utilizing NASCET criteria, using the distal internal carotid diameter as the denominator. CONTRAST:  31mL OMNIPAQUE IOHEXOL 350 MG/ML SOLN COMPARISON:  None. FINDINGS: CTA NECK Aortic arch: Minimal calcified plaque along the arch. Great vessel origins are patent. Right carotid system: Patent. Mild calcified plaque at the common carotid bifurcation. No measurable stenosis at the ICA origin. Left carotid system: Patent. No measurable stenosis at the ICA origin. Vertebral arteries: Patent and codominant. Skeleton: Multilevel degenerative changes of the cervical spine. Other neck: Polypoid soft tissue along the nasal septum. Heterogeneous, enlarged thyroid; no further ultrasound follow-up recommended given patient's age. Upper chest: No apical lung mass. Review of the MIP images confirms the above findings CTA HEAD Anterior circulation: Intracranial internal carotid arteries patent with mild calcified plaque. Anterior and  middle cerebral arteries are patent. Posterior circulation: Intracranial vertebral arteries are patent with mild calcified plaque. Basilar artery is patent. Posterior cerebral arteries are patent. Small right posterior communicating artery is identified. Venous sinuses: Patent as allowed by contrast bolus timing. Review of the MIP images confirms the above findings IMPRESSION: No large vessel occlusion or hemodynamically significant stenosis. Electronically Signed   By: Macy Mis M.D.   On: 08/24/2020 17:55   DG Chest 2 View  Result Date: 08/24/2020 CLINICAL DATA:  TIA. EXAM: CHEST - 2 VIEW COMPARISON:  November 05, 2016, June 04, 2020 FINDINGS: The cardiomediastinal silhouette is unchanged in contour. No pleural effusion. No pneumothorax. No acute pleuroparenchymal abnormality. Visualized abdomen is unremarkable. Compression fracture deformity at the thoracolumbar junction, increased since October 2021. IMPRESSION: 1. No acute cardiopulmonary abnormality. 2. Compression fracture deformity at the thoracolumbar junction, increased since October 2021. Electronically Signed   By: Valentino Saxon MD   On: 08/24/2020 18:35   CT ANGIO NECK W OR WO CONTRAST  Result Date: 08/24/2020 CLINICAL DATA:  Expressive aphasia, resolved EXAM: CT ANGIOGRAPHY HEAD AND NECK TECHNIQUE: Multidetector CT imaging of the head and neck was performed using the  standard protocol during bolus administration of intravenous contrast. Multiplanar CT image reconstructions and MIPs were obtained to evaluate the vascular anatomy. Carotid stenosis measurements (when applicable) are obtained utilizing NASCET criteria, using the distal internal carotid diameter as the denominator. CONTRAST:  51mL OMNIPAQUE IOHEXOL 350 MG/ML SOLN COMPARISON:  None. FINDINGS: CTA NECK Aortic arch: Minimal calcified plaque along the arch. Great vessel origins are patent. Right carotid system: Patent. Mild calcified plaque at the common carotid bifurcation. No  measurable stenosis at the ICA origin. Left carotid system: Patent. No measurable stenosis at the ICA origin. Vertebral arteries: Patent and codominant. Skeleton: Multilevel degenerative changes of the cervical spine. Other neck: Polypoid soft tissue along the nasal septum. Heterogeneous, enlarged thyroid; no further ultrasound follow-up recommended given patient's age. Upper chest: No apical lung mass. Review of the MIP images confirms the above findings CTA HEAD Anterior circulation: Intracranial internal carotid arteries patent with mild calcified plaque. Anterior and middle cerebral arteries are patent. Posterior circulation: Intracranial vertebral arteries are patent with mild calcified plaque. Basilar artery is patent. Posterior cerebral arteries are patent. Small right posterior communicating artery is identified. Venous sinuses: Patent as allowed by contrast bolus timing. Review of the MIP images confirms the above findings IMPRESSION: No large vessel occlusion or hemodynamically significant stenosis. Electronically Signed   By: Macy Mis M.D.   On: 08/24/2020 17:55   MR BRAIN WO CONTRAST  Result Date: 08/24/2020 CLINICAL DATA:  TIA EXAM: MRI HEAD WITHOUT CONTRAST TECHNIQUE: Multiplanar, multiecho pulse sequences of the brain and surrounding structures were obtained without intravenous contrast. COMPARISON:  09/22/2019 FINDINGS: Brain: There is no acute infarction or intracranial hemorrhage. There is no intracranial mass, mass effect, or edema. There is no hydrocephalus or extra-axial fluid collection. Prominence of the ventricles and sulci reflects stable parenchymal volume loss. Patchy and confluent areas of T2 hyperintensity in the supratentorial white matter are nonspecific but probably reflect stable advanced chronic microvascular ischemic changes. Vascular: Major vessel flow voids at the skull base are preserved. Skull and upper cervical spine: Normal marrow signal is preserved. Sinuses/Orbits:  Polypoid mucosal thickening. Right frontal sinus and outflow are opacified. There is polypoid tissue along the nasal septum. Appearance is similar to prior study. Bilateral lens replacements. Other: Sella is unremarkable. Mild patchy mastoid fluid opacification. IMPRESSION: No acute infarction, hemorrhage, or mass. Stable advanced chronic microvascular ischemic changes. Similar appearance of probable sinonasal polyposis. Electronically Signed   By: Macy Mis M.D.   On: 08/24/2020 18:20   ECHOCARDIOGRAM COMPLETE  Result Date: 08/25/2020    ECHOCARDIOGRAM REPORT   Patient Name:   ROMINA DIVIRGILIO Date of Exam: 08/25/2020 Medical Rec #:  [id number]   Height:       58.0 in Accession #:    [id number]  Weight:       90.2 lb Date of Birth:  04/17/1929    BSA:          1.298 m Patient Age:    78 years    BP:           145/75 mmHg Patient Gender: F           HR:           82 bpm. Exam Location:  Inpatient Procedure: 2D Echo, Cardiac Doppler and Color Doppler Indications:    TIA 435.9 / G45.9  History:        Patient has no prior history of Echocardiogram examinations.  Risk Factors:Dyslipidemia.  Sonographer:    Tiffany Dance Referring Phys: [id number] Margaret  1. Left ventricular ejection fraction, by estimation, is 60 to 65%. The left ventricle has normal function. The left ventricle has no regional wall motion abnormalities. Indeterminate diastolic filling due to E-A fusion.  2. Right ventricular systolic function is normal. The right ventricular size is normal.  3. The mitral valve is abnormal. Trivial mitral valve regurgitation.  4. The aortic valve is normal in structure. Aortic valve regurgitation is not visualized.  5. The inferior vena cava is normal in size with greater than 50% respiratory variability, suggesting right atrial pressure of 3 mmHg. FINDINGS  Left Ventricle: Left ventricular ejection fraction, by estimation, is 60 to 65%. The left ventricle has normal function. The left  ventricle has no regional wall motion abnormalities. The left ventricular internal cavity size was normal in size. There is  no left ventricular hypertrophy. Indeterminate diastolic filling due to E-A fusion. Right Ventricle: The right ventricular size is normal. Right vetricular wall thickness was not assessed. Right ventricular systolic function is normal. Left Atrium: Left atrial size was normal in size. Right Atrium: Right atrial size was normal in size. Pericardium: There is no evidence of pericardial effusion. Mitral Valve: The mitral valve is abnormal. There is mild thickening of the mitral valve leaflet(s). Trivial mitral valve regurgitation. Tricuspid Valve: The tricuspid valve is normal in structure. Tricuspid valve regurgitation is trivial. Aortic Valve: The aortic valve is normal in structure. Aortic valve regurgitation is not visualized. Pulmonic Valve: The pulmonic valve was normal in structure. Pulmonic valve regurgitation is not visualized. Aorta: The aortic root is normal in size and structure. Venous: The inferior vena cava is normal in size with greater than 50% respiratory variability, suggesting right atrial pressure of 3 mmHg. IAS/Shunts: The interatrial septum was not assessed.  LEFT VENTRICLE PLAX 2D LVIDd:         3.40 cm  Diastology LVIDs:         2.40 cm  LV e' medial:    4.13 cm/s LV PW:         1.00 cm  LV E/e' medial:  1.0 LV IVS:        0.90 cm  LV e' lateral:   5.22 cm/s LVOT diam:     1.90 cm  LV E/e' lateral: 0.8 LV SV:         40 LV SV Index:   31 LVOT Area:     2.84 cm  RIGHT VENTRICLE             IVC RV Basal diam:  2.00 cm     IVC diam: 1.10 cm RV S prime:     11.40 cm/s TAPSE (M-mode): 1.6 cm LEFT ATRIUM             Index       RIGHT ATRIUM          Index LA diam:        2.90 cm 2.23 cm/m  RA Area:     8.71 cm LA Vol (A2C):   22.2 ml 17.10 ml/m RA Volume:   14.00 ml 10.79 ml/m LA Vol (A4C):   16.3 ml 12.56 ml/m LA Biplane Vol: 20.7 ml 15.95 ml/m  AORTIC VALVE LVOT Vmax:    80.65 cm/s LVOT Vmean:  57.400 cm/s LVOT VTI:    0.142 m  AORTA Ao Root diam: 2.90 cm Ao Asc diam:  2.50 cm MV E velocity: 4.24 cm/s  MV A velocity: 104.25 cm/s  SHUNTS MV E/A ratio:  0.04         Systemic VTI:  0.14 m                             Systemic Diam: 1.90 cm Dorris Carnes MD Electronically signed by Dorris Carnes MD Signature Date/Time: 08/25/2020/1:52:53 PM    Final    CT HEAD CODE STROKE WO CONTRAST  Result Date: 08/24/2020 CLINICAL DATA:  Acute neuro deficit, stroke suspected. History of TIA EXAM: CT HEAD WITHOUT CONTRAST TECHNIQUE: Contiguous axial images were obtained from the base of the skull through the vertex without intravenous contrast. COMPARISON:  09/22/2019 FINDINGS: Brain: There is no acute intracranial hemorrhage, mass effect, or edema. No new loss of gray-white differentiation. Confluent hypoattenuation in the supratentorial white matter is nonspecific but probably reflects stable advanced chronic microvascular ischemic changes. Prominence of the ventricles and sulci reflects stable parenchymal volume loss. No extra-axial collection. Vascular: No hyperdense vessel. There is intracranial atherosclerotic calcification at the skull base. Skull: Unremarkable. Sinuses/Orbits: Opacified right frontal sinus and outflow. No significant orbital finding. Other: Minimal right mastoid opacification. ASPECTS ([place] Stroke Program Early CT Score) - Ganglionic level infarction (caudate, lentiform nuclei, internal capsule, insula, M1-M3 cortex): 7 - Supraganglionic infarction (M4-M6 cortex): 3 Total score (0-10 with 10 being normal): 10 IMPRESSION: There is no acute intracranial hemorrhage or evidence of acute infarction. ASPECT score is 10. Electronically Signed   By: Macy Mis M.D.   On: 08/24/2020 15:10    Microbiology: Recent Results (from the past 240 hour(s))  SARS CORONAVIRUS 2 (TAT 6-24 HRS) Nasopharyngeal Nasopharyngeal Swab     Status: None   Collection Time: 08/24/20  5:03 PM    Specimen: Nasopharyngeal Swab  Result Value Ref Range Status   SARS Coronavirus 2 NEGATIVE NEGATIVE Final    Comment: (NOTE) SARS-CoV-2 target nucleic acids are NOT DETECTED.  The SARS-CoV-2 RNA is generally detectable in upper and lower respiratory specimens during the acute phase of infection. Negative results do not preclude SARS-CoV-2 infection, do not rule out co-infections with other pathogens, and should not be used as the sole basis for treatment or other patient management decisions. Negative results must be combined with clinical observations, patient history, and epidemiological information. The expected result is Negative.  Fact Sheet for Patients: SugarRoll.be  Fact Sheet for Healthcare Providers: https://www.woods-mathews.com/  This test is not yet approved or cleared by the [place] FDA and  has been authorized for detection and/or diagnosis of SARS-CoV-2 by FDA under an Emergency Use Authorization (EUA). This EUA will remain  in effect (meaning this test can be used) for the duration of the COVID-19 declaration under Se ction 564(b)(1) of the Act, 21 U.S.C. section 360bbb-3(b)(1), unless the authorization is terminated or revoked sooner.  Performed at [facility], [place] [address], [place], [place] [address]      Labs: Basic Metabolic Panel: Recent Labs  Lab 08/24/20 1446 08/24/20 1450 08/26/20 0043  NA 137 136 135  K 3.7 3.9 3.6  CL 99 99 99  CO2 25  --  27  GLUCOSE 145* 140* 105*  BUN 10 14 13   CREATININE 0.78 0.70 0.76  CALCIUM 9.5  --  8.9   Liver Function Tests: Recent Labs  Lab 08/24/20 1446  AST 21  ALT 14  ALKPHOS 43  BILITOT 0.6  PROT 6.0*  ALBUMIN 3.5   No results for  input(s): LIPASE, AMYLASE in the last 168 hours. No results for input(s): AMMONIA in the last 168 hours. CBC: Recent Labs  Lab 08/24/20 1446 08/24/20 1450 08/26/20 0043  WBC 9.6  --  6.5  NEUTROABS 8.3*  --    --   HGB 13.0 13.6 11.3*  HCT 41.1 40.0 34.8*  MCV 92.8  --  92.3  PLT 247  --  236   Cardiac Enzymes: No results for input(s): CKTOTAL, CKMB, CKMBINDEX, TROPONINI in the last 168 hours. BNP: BNP (last 3 results) No results for input(s): BNP in the last 8760 hours.  ProBNP (last 3 results) No results for input(s): PROBNP in the last 8760 hours.  CBG: Recent Labs  Lab 08/24/20 1549  GLUCAP 125*       Signed:  Sherma Vanmetre  [facility] 08/26/2020, 1:47 PM

## 2020-08-26 NOTE — Discharge Instructions (Signed)
Visual Disturbances  A visual disturbance is any problem that interferes with your normal vision. This can affect one eye or both eyes. Some types of visual disturbances come and go without treatment and do not cause a permanent problem. Other visual disturbances may be a sign of a medical emergency. Visual disturbances include:  Blurred vision.  Being unable to see certain colors.  Being sensitive to light.  Double vision.  Partial vision loss (visual field deficit).  Being unaware of objects on one side of the body (visual spatial inattention).  Rhythmic eye movements that you cannot control (nystagmus).  Short-term or long-term blindness.  Seeing: ? Floating spots or lines (floaters). ? Flashing or shimmering lights. ? Zigzagging lines or stars. ? The floor as tilted (visual midline shift). ? Things that are not really there (hallucinations). Causes of visual disturbances include:  Eye infection.  The thin membrane at the back of the eye separating from the eyeball (retinal detachment).  High blood pressure.  Migraine.  Glaucoma.  Ischemic stroke.  Cerebral aneurysm. It is important to get your eyes checked by a health care provider or eye specialist (ophthalmologist or optometrist) as soon as possible to determine the cause of your visual disturbance. Follow these instructions at home:  Take over-the-counter and prescription medicines only as told by your health care provider.  Do not use any products that contain nicotine or tobacco, such as cigarettes and e-cigarettes. If you need help quitting, ask your health care provider.  To lower your risk of the problems that can lead to visual disturbances: ? Eat a balanced diet that includes fruits and vegetables, whole grains, lean meat, and low-fat dairy. ? Maintain a healthy weight. Work with your health care provider to lose weight if you need to. ? Exercise regularly. Ask your health care provider what  activities are safe for you.  Do not drive if you have trouble seeing. Ask your health care provider for guidance about when it is and is not safe for you to drive.  Keep all follow-up visits as told by your health care provider. This is important. Contact a health care provider if:  Your visual disturbance changes or becomes worse. Get help right away if you:  Have new visual disturbances.  Suddenly see flashing lights or floaters.  Suddenly have a dark area in your field of vision, especially in the lower part. This can lead to a loss of central vision.  Lose vision in one or both eyes.  Have any symptoms of a stroke. "BE FAST" is an easy way to remember the main warning signs of a stroke: ? B - Balance. Signs are dizziness, sudden trouble walking, or loss of balance. ? E - Eyes. Signs are trouble seeing or a sudden change in vision. ? F - Face. Signs are sudden weakness or numbness of the face, or the face or eyelid drooping on one side. ? A - Arms. Signs are weakness or numbness in an arm. This happens suddenly and usually on one side of the body. ? S - Speech. Signs are sudden trouble speaking, slurred speech, or trouble understanding what people say. ? T - Time. Time to call emergency services. Write down what time symptoms started.  Have other signs of a stroke, such as: ? A sudden, severe headache with no known cause. ? Nausea or vomiting. ? Seizure. These symptoms may represent a serious problem that is an emergency. Do not wait to see if the symptoms will go away.  Get medical help right away. Call your local emergency services (911 in the [place] Do not drive yourself to the hospital.   Summary  A visual disturbance is any problem that interferes with your normal vision.  Some visual disturbances may be a sign of a medical emergency.  It is important to get your eyes checked by a health care provider or eye specialist to determine what kind of visual disturbance you  have. This information is not intended to replace advice given to you by your health care provider. Make sure you discuss any questions you have with your health care provider. Document Revised: 11/22/2018 Document Reviewed: 08/24/2017 Elsevier Patient Education  2021 [place].   Transient Ischemic Attack A transient ischemic attack (TIA) causes stroke-like symptoms that go away quickly. Having a TIA means that a person is at higher risk for a stroke. A TIA happens when blood supply to the brain is blocked temporarily. A TIA is a medical emergency. What are the causes? This condition is caused by a temporary blockage in an artery in the head or neck. This means the brain does not get the blood supply it needs. There is no permanent brain damage with a TIA. A blockage can be caused by:  Fatty buildup in an artery in the head or neck (atherosclerosis).  A blood clot.  An artery tear (dissection).  Inflammation of an artery (vasculitis). Sometimes the cause is not known. What increases the risk? Certain factors may make you more likely to develop this condition. Some of these are things that you can change, such as:  Obesity.  Using products that contain nicotine or tobacco.  Taking oral birth control, especially if you also use tobacco.  Not being active.  Heavy alcohol use.  Drug use, especially cocaine and methamphetamine. Medical conditions that may increase your risk include:  High blood pressure (hypertension).  High cholesterol.  Diabetes.  Heart disease (coronary artery disease).  An irregular heartbeat, also called atrial fibrillation (AFib).  Sickle cell disease.  Sleep problems (sleep apnea).  Chronic inflammatory diseases, such as rheumatoid arthritis or lupus.  Blood clotting disorders (hypercoagulable state). Other risk factors include:  Being over the age of 20.  Being female.  Family history of stroke.  Previous history of blood clots,  stroke, TIA, or heart attack.  Having a history of preeclampsia.  Migraine headache. What are the signs or symptoms? Symptoms of a TIA are the same as those of a stroke. The symptoms develop suddenly, and then go away quickly. They may include:  Weakness or numbness in your face, arm, or leg, especially on one side of your body.  Trouble walking or moving your arms or legs.  Trouble speaking, understanding speech, or both (aphasia).  Vision changes, such as double vision, blurred vision, or loss of vision.  Dizziness.  Confusion.  Loss of balance or coordination.  Nausea and vomiting.  Severe headache. If possible, note what time your symptoms started. Tell your health care provider.   How is this diagnosed? This condition may be diagnosed based on:  Your symptoms and medical history.  A physical exam.  Imaging tests, usually a CT scan or MRI of the brain.  Blood tests. You may also have other tests, including:  Electrocardiogram (ECG).  Echocardiogram.  Carotid ultrasound.  A scan of blood circulation in the brain (CT angiogram or MR angiogram).  Continuous heart monitoring. How is this treated? The goal of treatment is to reduce the risk for a  stroke. Stroke prevention therapies may include:  Changes to diet and lifestyle, such as being physically active and stopping smoking.  Medicines to thin the blood (antiplatelets or anticoagulants).  Blood pressure medicines.  Medicines to reduce cholesterol.  Treating other health conditions, such as diabetes or AFib. If testing shows a narrowing in the arteries to your brain, your health care provider may recommend a procedure, such as:  Carotid endarterectomy. This is done to remove the blockage from your artery.  Carotid angioplasty and stenting. This uses a tube (stent) to open or widen an artery in the neck. The stent helps keep the artery open by supporting the artery walls. Follow these instructions at  home: Medicines  Take over-the-counter and prescription medicines only as told by your health care provider.  If you were told to take a medicine to thin your blood, such as aspirin or an anticoagulant, take it exactly as told by your health care provider. ? Taking too much blood-thinning medicine can cause bleeding. Taking too little will not protect you against a stroke and other problems. Eating and drinking  Eat 5 or more servings of fruits and vegetables each day.  Follow guidelines from your health care provider about your diet. You may need to follow a certain diet to help manage risk factors for stroke. This may include: ? Eating a low-fat, low-salt diet. ? Choosing high-fiber foods. ? Limiting carbohydrates and sugar.  If you drink alcohol: ? Limit how much you have to:  0-1 drink a day for women who are not pregnant.  0-2 drinks a day for men. ? Know how much alcohol is in a drink. In the [place] one drink equals one 12 oz bottle of beer (344mL), one 5 oz glass of wine (140mL), or one 1 oz glass of hard liquor (48mL).   General instructions  Maintain a healthy weight.  Try to get at least 30 minutes of exercise on most days.  Get treatment if you have sleep apnea.  Do not use any products that contain nicotine or tobacco. These products include cigarettes, chewing tobacco, and vaping devices, such as e-cigarettes. If you need help quitting, ask your health care provider.  Do not use drugs.  Keep all follow-up visits. This is important. Where to find more information  American Stroke Association: www.stroke.org Get help right away if:  You have chest pain or an irregular heartbeat.  You have any symptoms of a stroke. "BE FAST" is an easy way to remember the main warning signs of a stroke. ? B - Balance. Signs are dizziness, sudden trouble walking, or loss of balance. ? E - Eyes. Signs are trouble seeing or a sudden change in vision. ? F - Face. Signs are sudden  weakness or numbness of the face, or the face or eyelid drooping on one side. ? A - Arms. Signs are weakness or numbness in an arm. This happens suddenly and usually on one side of the body. ? S - Speech. Signs are sudden trouble speaking, slurred speech, or trouble understanding what people say. ? T - Time. Time to call emergency services. Write down what time symptoms started.  You have other signs of a stroke, such as: ? A sudden, severe headache with no known cause. ? Nausea or vomiting. ? Seizure. These symptoms may represent a serious problem that is an emergency. Do not wait to see if the symptoms will go away. Get medical help right away. Call your local emergency services (911 in  the [place] Do not drive yourself to the hospital. Summary  A transient ischemic attack (TIA) happens when an artery in the head or neck is blocked. The blockage clears before there is any permanent brain damage. A TIA is a medical emergency.  Symptoms of a TIA are the same as those of a stroke. The symptoms develop suddenly, and then go away quickly.  Having a TIA means that you are at higher risk for a stroke.  The goal of treatment is to reduce your risk for a stroke. Treatment may include medicines to thin the blood and changes to diet and lifestyle. This information is not intended to replace advice given to you by your health care provider. Make sure you discuss any questions you have with your health care provider. Document Revised: 02/27/2020 Document Reviewed: 02/27/2020 Elsevier Patient Education  2021 [place].  Seizure, Adult A seizure is a sudden burst of abnormal electrical and chemical activity in the brain. Seizures usually last from 30 seconds to 2 minutes.  What are the causes? Common causes of this condition include:  Fever or infection.  Problems that affect the brain. These may include: ? A brain or head injury. ? Bleeding in the brain. ? A brain tumor.  Low levels of  blood sugar or salt.  Kidney problems or liver problems.  Conditions that are passed from parent to child (are inherited).  Problems with a substance, such as: ? Having a reaction to a drug or a medicine. ? Stopping the use of a substance all of a sudden (withdrawal).  A stroke.  Disorders that affect how you develop. Sometimes, the cause may not be known.  What increases the risk?  Having someone in your family who has epilepsy. In this condition, seizures happen again and again over time. They have no clear cause.  Having had a tonic-clonic seizure before. This type of seizure causes you to: ? Tighten the muscles of the whole body. ? Lose consciousness.  Having had a head injury or strokes before.  Having had a lack of oxygen at birth. What are the signs or symptoms? There are many types of seizures. The symptoms vary depending on the type of seizure you have. Symptoms during a seizure  Shaking that you cannot control (convulsions) with fast, jerky movements of muscles.  Stiffness of the body.  Breathing problems.  Feeling mixed up (confused).  Staring or not responding to sound or touch.  Head nodding.  Eyes that blink, flutter, or move fast.  Drooling, grunting, or making clicking sounds with your mouth  Losing control of when you pee or poop. Symptoms before a seizure  Feeling afraid, nervous, or worried.  Feeling like you may vomit.  Feeling like: ? You are moving when you are not. ? Things around you are moving when they are not.  Feeling like you saw or heard something before (dj vu).  Odd tastes or smells.  Changes in how you see. You may see flashing lights or spots. Symptoms after a seizure  Feeling confused.  Feeling sleepy.  Headache.  Sore muscles. How is this treated? If your seizure stops on its own, you will not need treatment. If your seizure lasts longer than 5 minutes, you will normally need treatment. Treatment may  include:  Medicines given through an IV tube.  Avoiding things, such as medicines, that are known to cause your seizures.  Medicines to prevent seizures.  A device to prevent or control seizures.  Surgery.  A diet low in carbohydrates and high in fat (ketogenic diet). Follow these instructions at home: Medicines  Take over-the-counter and prescription medicines only as told by your doctor.  Avoid foods or drinks that may keep your medicine from working, such as alcohol. Activity  Follow instructions about driving, swimming, or doing things that would be dangerous if you had another seizure. Wait until your doctor says it is safe for you to do these things.  If you live in the [place] ask your local department of motor vehicles when you can drive.  Get a lot of rest. Teaching others  Teach friends and family what to do when you have a seizure. They should: ? Help you get down to the ground. ? Protect your head and body. ? Loosen any clothing around your neck. ? Turn you on your side. ? Know whether or not you need emergency care. ? Stay with you until you are better.  Also, tell them what not to do if you have a seizure. Tell them: ? They should not hold you down. ? They should not put anything in your mouth.   General instructions  Avoid anything that gives you seizures.  Keep a seizure diary. Write down: ? What you remember about each seizure. ? What you think caused each seizure.  Keep all follow-up visits. Contact a doctor if:  You have another seizure or seizures. Call the doctor each time you have a seizure.  The pattern of your seizures changes.  You keep having seizures with treatment.  You have symptoms of being sick or having an infection.  You are not able to take your medicine. Get help right away if:  You have any of these problems: ? A seizure that lasts longer than 5 minutes. ? Many seizures in a row and you do not feel better between  seizures. ? A seizure that makes it harder to breathe. ? A seizure and you can no longer speak or use part of your body.  You do not wake up right after a seizure.  You get hurt during a seizure.  You feel confused or have pain right after a seizure. These symptoms may be an emergency. Get help right away. Call your local emergency services (911 in the [place]  Do not wait to see if the symptoms will go away.  Do not drive yourself to the hospital. Summary  A seizure is a sudden burst of abnormal electrical and chemical activity in the brain. Seizures normally last from 30 seconds to 2 minutes.  Causes of seizures include illness, injury to the head, low levels of blood sugar or salt, and certain conditions.  Most seizures will stop on their own in less than 5 minutes. Seizures that last longer than 5 minutes are a medical emergency and need treatment right away.  Many medicines are used to treat seizures. Take over-the-counter and prescription medicines only as told by your doctor. This information is not intended to replace advice given to you by your health care provider. Make sure you discuss any questions you have with your health care provider. Document Revised: 02/09/2020 Document Reviewed: 02/09/2020 Elsevier Patient Education  [place].

## 2020-08-28 DIAGNOSIS — M810 Age-related osteoporosis without current pathological fracture: Secondary | ICD-10-CM | POA: Diagnosis not present

## 2020-08-28 DIAGNOSIS — G4700 Insomnia, unspecified: Secondary | ICD-10-CM | POA: Diagnosis not present

## 2020-08-28 DIAGNOSIS — E7800 Pure hypercholesterolemia, unspecified: Secondary | ICD-10-CM | POA: Diagnosis not present

## 2020-08-28 DIAGNOSIS — G459 Transient cerebral ischemic attack, unspecified: Secondary | ICD-10-CM | POA: Diagnosis not present

## 2020-08-29 DIAGNOSIS — R441 Visual hallucinations: Secondary | ICD-10-CM | POA: Diagnosis not present

## 2020-08-29 DIAGNOSIS — G459 Transient cerebral ischemic attack, unspecified: Secondary | ICD-10-CM | POA: Diagnosis not present

## 2020-09-02 ENCOUNTER — Institutional Professional Consult (permissible substitution): Payer: Medicare HMO | Admitting: Neurology

## 2020-09-11 DIAGNOSIS — I119 Hypertensive heart disease without heart failure: Secondary | ICD-10-CM | POA: Diagnosis not present

## 2020-09-11 DIAGNOSIS — M47812 Spondylosis without myelopathy or radiculopathy, cervical region: Secondary | ICD-10-CM | POA: Diagnosis not present

## 2020-09-11 DIAGNOSIS — K5700 Diverticulitis of small intestine with perforation and abscess without bleeding: Secondary | ICD-10-CM | POA: Diagnosis not present

## 2020-09-11 DIAGNOSIS — M810 Age-related osteoporosis without current pathological fracture: Secondary | ICD-10-CM | POA: Diagnosis not present

## 2020-09-11 DIAGNOSIS — H5310 Unspecified subjective visual disturbances: Secondary | ICD-10-CM | POA: Diagnosis not present

## 2020-09-11 DIAGNOSIS — I081 Rheumatic disorders of both mitral and tricuspid valves: Secondary | ICD-10-CM | POA: Diagnosis not present

## 2020-09-11 DIAGNOSIS — M5030 Other cervical disc degeneration, unspecified cervical region: Secondary | ICD-10-CM | POA: Diagnosis not present

## 2020-09-11 DIAGNOSIS — I700 Atherosclerosis of aorta: Secondary | ICD-10-CM | POA: Diagnosis not present

## 2020-09-11 DIAGNOSIS — E785 Hyperlipidemia, unspecified: Secondary | ICD-10-CM | POA: Diagnosis not present

## 2020-09-11 DIAGNOSIS — K5790 Diverticulosis of intestine, part unspecified, without perforation or abscess without bleeding: Secondary | ICD-10-CM | POA: Diagnosis not present

## 2020-09-12 DIAGNOSIS — M47812 Spondylosis without myelopathy or radiculopathy, cervical region: Secondary | ICD-10-CM | POA: Diagnosis not present

## 2020-09-12 DIAGNOSIS — M810 Age-related osteoporosis without current pathological fracture: Secondary | ICD-10-CM | POA: Diagnosis not present

## 2020-09-12 DIAGNOSIS — H5310 Unspecified subjective visual disturbances: Secondary | ICD-10-CM | POA: Diagnosis not present

## 2020-09-12 DIAGNOSIS — I119 Hypertensive heart disease without heart failure: Secondary | ICD-10-CM | POA: Diagnosis not present

## 2020-09-12 DIAGNOSIS — K5790 Diverticulosis of intestine, part unspecified, without perforation or abscess without bleeding: Secondary | ICD-10-CM | POA: Diagnosis not present

## 2020-09-12 DIAGNOSIS — M5030 Other cervical disc degeneration, unspecified cervical region: Secondary | ICD-10-CM | POA: Diagnosis not present

## 2020-09-12 DIAGNOSIS — I700 Atherosclerosis of aorta: Secondary | ICD-10-CM | POA: Diagnosis not present

## 2020-09-12 DIAGNOSIS — E785 Hyperlipidemia, unspecified: Secondary | ICD-10-CM | POA: Diagnosis not present

## 2020-09-12 DIAGNOSIS — I081 Rheumatic disorders of both mitral and tricuspid valves: Secondary | ICD-10-CM | POA: Diagnosis not present

## 2020-09-16 ENCOUNTER — Other Ambulatory Visit: Payer: Self-pay

## 2020-09-16 ENCOUNTER — Encounter: Payer: Self-pay | Admitting: Neurology

## 2020-09-16 ENCOUNTER — Ambulatory Visit: Payer: Medicare HMO | Admitting: Neurology

## 2020-09-16 VITALS — BP 184/94 | HR 88 | Ht <= 58 in | Wt 89.6 lb

## 2020-09-16 DIAGNOSIS — R410 Disorientation, unspecified: Secondary | ICD-10-CM | POA: Diagnosis not present

## 2020-09-16 DIAGNOSIS — H5310 Unspecified subjective visual disturbances: Secondary | ICD-10-CM

## 2020-09-16 MED ORDER — METOPROLOL SUCCINATE ER 25 MG PO TB24: 25.0000 mg | ORAL_TABLET | Freq: Every day | ORAL | 3 refills | Status: DC

## 2020-09-16 NOTE — Patient Instructions (Signed)
We will start Toprol 25 mg once a day.

## 2020-09-16 NOTE — Progress Notes (Signed)
Reason for visit: Possible TIA  Referring physician: [facility]  Jill Pitts is a 87 y.o. female  History of present illness:  [name] is an 87 year old right-handed white female with a history of transient visual events off and on over the last 3 decades.  The patient has been seen in the past for these events.  The patient may have loss of vision off to the right that will come on and last about 30 or 40 minutes and then resolve.  Usually the patient may have some headache following the events but the headache is quite mild in nature, usually on the left frontotemporal region.  The patient was felt to have migraine headache.  The patient was admitted to the hospital on 24 August 2020 with an episode of confusion and slurring of speech.  The evening before, she had one of her usual visual episodes that came on and resolved.  On the day of admission, the patient had a telephone in her hand but did not really know what it was and did not know how to operate it.  She felt confused and banged on the wall to get the attention of her son who lives with her.  The patient was noted to have some slurring of speech and difficulty with speech.  She went to the hospital by ambulance, by the time she got to the hospital most of her symptoms were clearing some.  The patient underwent MRI of the brain without acute changes seen, but the patient does have chronic small vessel disease of moderate to severe severity.  CT angiogram of the head and neck were unremarkable, and an EEG study suggested some left temporal slowing.  For this reason, the patient was placed on Keppra but she had significant confusion on this medication with hallucinations.  The patient went off the medication and it took 7 to 10 days to fully recover.  The patient had an event yesterday of an out of body experience and some left forearm numbness.  No weakness was noted.  The patient denied any vision changes with this event.  The patient has  had some low back pain associated with an L3 compression fracture that occurred in the fall 2021.  The patient comes to the office today for further evaluation.  Past Medical History:  Diagnosis Date  . Degenerative disc disease, cervical   . Diverticulitis   . Eczema   . Headache 10/25/2014  . Hyperlipidemia   . Osteoarthritis   . Osteoporosis   . Seborrheic dermatitis   . Sinusitis   . Stroke ([place]) 08/25/2019  . Subjective visual disturbance 10/25/2014  . TIA (transient ischemic attack)   . Tinnitus     Past Surgical History:  Procedure Laterality Date  . ABDOMINAL HYSTERECTOMY    . APPENDECTOMY  1981  . BIOPSY  09/25/2019   Procedure: BIOPSY;  Surgeon: Ronald Lobo, MD;  Location: [place];  Service: Endoscopy;;  . CARPAL TUNNEL RELEASE Right 1998  . CATARACT EXTRACTION Bilateral   . COLONOSCOPY WITH PROPOFOL N/A 09/25/2019   Procedure: COLONOSCOPY WITH PROPOFOL;  Surgeon: Ronald Lobo, MD;  Location: [place];  Service: Endoscopy;  Laterality: N/A;  . FLEXIBLE SIGMOIDOSCOPY N/A 09/27/2019   Procedure: FLEXIBLE SIGMOIDOSCOPY;  Surgeon: Ronald Lobo, MD;  Location: [facility] ENDOSCOPY;  Service: Endoscopy;  Laterality: N/A;  . NASAL POLYP SURGERY    . POLYPECTOMY  09/27/2019   Procedure: POLYPECTOMY;  Surgeon: Ronald Lobo, MD;  Location: [place] ENDOSCOPY;  Service: Endoscopy;;  . SUBMUCOSAL LIFTING INJECTION  09/27/2019   Procedure: SUBMUCOSAL LIFTING INJECTION;  Surgeon: Ronald Lobo, MD;  Location: [place];  Service: Endoscopy;;  . SUBMUCOSAL TATTOO INJECTION  09/27/2019   Procedure: SUBMUCOSAL TATTOO INJECTION;  Surgeon: Ronald Lobo, MD;  Location: [facility] ENDOSCOPY;  Service: Endoscopy;;  . TONSILLECTOMY AND ADENOIDECTOMY      Family History  Problem Relation Age of Onset  . Heart disease Father   . Prostate cancer Father   . Asthma Sister   . Hypertension Sister   . Diabetes Sister   . Stroke Brother     Social history:  reports that she has quit  smoking. She has never used smokeless tobacco. She reports current alcohol use. She reports that she does not use drugs.  Medications:  Prior to Admission medications   Medication Sig Start Date End Date Taking? Authorizing Provider  Ascorbic Acid (VITAMIN C) 500 MG CHEW Chew 500 mg by mouth daily.   Yes [provider]  Biotin 1 MG CAPS Take 1 mg by mouth daily. 09/28/19  Yes Barb Merino, MD  Calcium Carb-Cholecalciferol (CALCIUM + D3) 600-200 MG-UNIT TABS Take 1 tablet by mouth 2 (two) times daily.   Yes [provider]  clopidogrel (PLAVIX) 75 MG tablet Take 75 mg by mouth daily.   Yes [provider]  Co-Enzyme Q-10 30 MG CAPS Take 30 mg by mouth.   Yes [provider]  ferrous sulfate 325 (65 FE) MG tablet Take 325 mg by mouth daily.   Yes [provider]  lovastatin (MEVACOR) 20 MG tablet Take 20 mg by mouth daily. 08/28/14  Yes [provider]  Multiple Vitamins-Minerals (MULTIVITAMIN ADULT PO) Take 1 tablet by mouth daily.   Yes [provider]  triamcinolone (KENALOG) 0.1 % Apply 1 application topically daily. 06/06/20  Yes [provider]  Fish Oil OIL Take 1,400 mg by mouth 2 (two) times daily.    [provider]  levETIRAcetam (KEPPRA) 250 MG tablet Take 1 tablet (250 mg total) by mouth 2 (two) times daily. 08/26/20 09/25/20  Cristal Ford, DO      Allergies  Allergen Reactions  . Gabapentin     Tachycardia  . Keppra [Levetiracetam] Other (See Comments)    Hallucinations    ROS:  Out of a complete 14 system review of symptoms, the patient complains only of the following symptoms, and all other reviewed systems are negative.  Vision changes Numbness Low back pain  Blood pressure (!) 184/94, pulse 88, height 4\' 10"  (1.473 m), weight 89 lb 9.6 oz (40.6 kg).  Physical Exam  General: The patient is alert and cooperative at the time of the examination.  Eyes: Pupils are equal, round, and  reactive to light. Discs are flat bilaterally.  Neck: The neck is supple, no carotid bruits are noted.  Respiratory: The respiratory examination is clear.  Cardiovascular: The cardiovascular examination reveals a regular rate and rhythm, no obvious murmurs or rubs are noted.  Skin: Extremities are without significant edema.  Neurologic Exam  Mental status: The patient is alert and oriented x 3 at the time of the examination. The patient has apparent normal recent and remote memory, with an apparently normal attention span and concentration ability.  Mini-Mental status examination done today shows a total score 30/30.  Cranial nerves: Facial symmetry is present. There is good sensation of the face to pinprick and soft touch bilaterally. The strength of the facial muscles and the muscles to  head turning and shoulder shrug are normal bilaterally. Speech is well enunciated, no aphasia or dysarthria is noted. Extraocular movements are full. Visual fields are full. The tongue is midline, and the patient has symmetric elevation of the soft palate. No obvious hearing deficits are noted.  Motor: The motor testing reveals 5 over 5 strength of all 4 extremities. Good symmetric motor tone is noted throughout.  Sensory: Sensory testing is intact to pinprick, soft touch, vibration sensation, and position sense on all 4 extremities. No evidence of extinction is noted.  Coordination: Cerebellar testing reveals good finger-nose-finger and heel-to-shin bilaterally.  Gait and station: Gait is normal. Tandem gait is slightly unsteady. Romberg is negative. No drift is seen.  Reflexes: Deep tendon reflexes are symmetric and normal bilaterally, with exception of some depression of the ankle jerk reflexes bilaterally. Toes are downgoing bilaterally.   MRI brain 08/24/20:  IMPRESSION: No acute infarction, hemorrhage, or mass.  Stable advanced chronic microvascular ischemic changes.  Similar appearance of  probable sinonasal polyposis.  * MRI scan images were reviewed online. I agree with the written report.  2D Echo 08/25/20:  IMPRESSIONS    1. Left ventricular ejection fraction, by estimation, is 60 to 65%. The  left ventricle has normal function. The left ventricle has no regional  wall motion abnormalities. Indeterminate diastolic filling due to E-A  fusion.  2. Right ventricular systolic function is normal. The right ventricular  size is normal.  3. The mitral valve is abnormal. Trivial mitral valve regurgitation.  4. The aortic valve is normal in structure. Aortic valve regurgitation is  not visualized.  5. The inferior vena cava is normal in size with greater than 50%  respiratory variability, suggesting right atrial pressure of 3 mmHg.    CTA head and neck 08/24/20:  IMPRESSION: No large vessel occlusion or hemodynamically significant stenosis.    EEG 08/25/20:  ABNORMALITY -Intermittent slow, left anterior temporal region  IMPRESSION: This study is suggestive of cortical dysfunction in left anterior temporal region, non specific etiology. No seizures or definite epileptiform discharges were seen throughout the recording.   Assessment/Plan:  1.  Probable migraine headache  2.  Cerebrovascular disease by MRI brain  3.  Transient confusion  The patient was unable to tolerate the Keppra.  It is possible that the events that have been occurring recently may be migrainous in nature.  The son believes that the patient has not yet fully returned to her baseline cognitive status, we will follow the memory over time.  The patient will follow up here in 4 months.  I will start Toprol for headache prevention taking 25 mg daily.  She will call if she is having problems tolerating the medication.  Jill Alexanders MD 09/16/2020 10:21 AM  [facility] [address] [place], [place] [address]  Phone [phone] Fax [phone]

## 2020-09-18 DIAGNOSIS — H5310 Unspecified subjective visual disturbances: Secondary | ICD-10-CM | POA: Diagnosis not present

## 2020-09-18 DIAGNOSIS — M47812 Spondylosis without myelopathy or radiculopathy, cervical region: Secondary | ICD-10-CM | POA: Diagnosis not present

## 2020-09-18 DIAGNOSIS — E785 Hyperlipidemia, unspecified: Secondary | ICD-10-CM | POA: Diagnosis not present

## 2020-09-18 DIAGNOSIS — I119 Hypertensive heart disease without heart failure: Secondary | ICD-10-CM | POA: Diagnosis not present

## 2020-09-18 DIAGNOSIS — I081 Rheumatic disorders of both mitral and tricuspid valves: Secondary | ICD-10-CM | POA: Diagnosis not present

## 2020-09-18 DIAGNOSIS — I700 Atherosclerosis of aorta: Secondary | ICD-10-CM | POA: Diagnosis not present

## 2020-09-18 DIAGNOSIS — M810 Age-related osteoporosis without current pathological fracture: Secondary | ICD-10-CM | POA: Diagnosis not present

## 2020-09-18 DIAGNOSIS — M5030 Other cervical disc degeneration, unspecified cervical region: Secondary | ICD-10-CM | POA: Diagnosis not present

## 2020-09-18 DIAGNOSIS — K5790 Diverticulosis of intestine, part unspecified, without perforation or abscess without bleeding: Secondary | ICD-10-CM | POA: Diagnosis not present

## 2020-09-19 DIAGNOSIS — E785 Hyperlipidemia, unspecified: Secondary | ICD-10-CM | POA: Diagnosis not present

## 2020-09-19 DIAGNOSIS — I119 Hypertensive heart disease without heart failure: Secondary | ICD-10-CM | POA: Diagnosis not present

## 2020-09-19 DIAGNOSIS — M47812 Spondylosis without myelopathy or radiculopathy, cervical region: Secondary | ICD-10-CM | POA: Diagnosis not present

## 2020-09-19 DIAGNOSIS — H5310 Unspecified subjective visual disturbances: Secondary | ICD-10-CM | POA: Diagnosis not present

## 2020-09-19 DIAGNOSIS — M5030 Other cervical disc degeneration, unspecified cervical region: Secondary | ICD-10-CM | POA: Diagnosis not present

## 2020-09-19 DIAGNOSIS — I081 Rheumatic disorders of both mitral and tricuspid valves: Secondary | ICD-10-CM | POA: Diagnosis not present

## 2020-09-19 DIAGNOSIS — I700 Atherosclerosis of aorta: Secondary | ICD-10-CM | POA: Diagnosis not present

## 2020-09-19 DIAGNOSIS — K5790 Diverticulosis of intestine, part unspecified, without perforation or abscess without bleeding: Secondary | ICD-10-CM | POA: Diagnosis not present

## 2020-09-19 DIAGNOSIS — M810 Age-related osteoporosis without current pathological fracture: Secondary | ICD-10-CM | POA: Diagnosis not present

## 2020-09-20 DIAGNOSIS — I700 Atherosclerosis of aorta: Secondary | ICD-10-CM | POA: Diagnosis not present

## 2020-09-20 DIAGNOSIS — M810 Age-related osteoporosis without current pathological fracture: Secondary | ICD-10-CM | POA: Diagnosis not present

## 2020-09-20 DIAGNOSIS — E785 Hyperlipidemia, unspecified: Secondary | ICD-10-CM | POA: Diagnosis not present

## 2020-09-20 DIAGNOSIS — I119 Hypertensive heart disease without heart failure: Secondary | ICD-10-CM | POA: Diagnosis not present

## 2020-09-20 DIAGNOSIS — I081 Rheumatic disorders of both mitral and tricuspid valves: Secondary | ICD-10-CM | POA: Diagnosis not present

## 2020-09-20 DIAGNOSIS — M5030 Other cervical disc degeneration, unspecified cervical region: Secondary | ICD-10-CM | POA: Diagnosis not present

## 2020-09-20 DIAGNOSIS — H5310 Unspecified subjective visual disturbances: Secondary | ICD-10-CM | POA: Diagnosis not present

## 2020-09-20 DIAGNOSIS — K5790 Diverticulosis of intestine, part unspecified, without perforation or abscess without bleeding: Secondary | ICD-10-CM | POA: Diagnosis not present

## 2020-09-20 DIAGNOSIS — M47812 Spondylosis without myelopathy or radiculopathy, cervical region: Secondary | ICD-10-CM | POA: Diagnosis not present

## 2020-09-23 DIAGNOSIS — M47812 Spondylosis without myelopathy or radiculopathy, cervical region: Secondary | ICD-10-CM | POA: Diagnosis not present

## 2020-09-23 DIAGNOSIS — E785 Hyperlipidemia, unspecified: Secondary | ICD-10-CM | POA: Diagnosis not present

## 2020-09-23 DIAGNOSIS — H5310 Unspecified subjective visual disturbances: Secondary | ICD-10-CM | POA: Diagnosis not present

## 2020-09-23 DIAGNOSIS — I119 Hypertensive heart disease without heart failure: Secondary | ICD-10-CM | POA: Diagnosis not present

## 2020-09-23 DIAGNOSIS — I700 Atherosclerosis of aorta: Secondary | ICD-10-CM | POA: Diagnosis not present

## 2020-09-23 DIAGNOSIS — I081 Rheumatic disorders of both mitral and tricuspid valves: Secondary | ICD-10-CM | POA: Diagnosis not present

## 2020-09-23 DIAGNOSIS — K5790 Diverticulosis of intestine, part unspecified, without perforation or abscess without bleeding: Secondary | ICD-10-CM | POA: Diagnosis not present

## 2020-09-23 DIAGNOSIS — M810 Age-related osteoporosis without current pathological fracture: Secondary | ICD-10-CM | POA: Diagnosis not present

## 2020-09-23 DIAGNOSIS — M5030 Other cervical disc degeneration, unspecified cervical region: Secondary | ICD-10-CM | POA: Diagnosis not present

## 2020-09-26 DIAGNOSIS — K5790 Diverticulosis of intestine, part unspecified, without perforation or abscess without bleeding: Secondary | ICD-10-CM | POA: Diagnosis not present

## 2020-09-26 DIAGNOSIS — I081 Rheumatic disorders of both mitral and tricuspid valves: Secondary | ICD-10-CM | POA: Diagnosis not present

## 2020-09-26 DIAGNOSIS — M5030 Other cervical disc degeneration, unspecified cervical region: Secondary | ICD-10-CM | POA: Diagnosis not present

## 2020-09-26 DIAGNOSIS — I119 Hypertensive heart disease without heart failure: Secondary | ICD-10-CM | POA: Diagnosis not present

## 2020-09-26 DIAGNOSIS — M47812 Spondylosis without myelopathy or radiculopathy, cervical region: Secondary | ICD-10-CM | POA: Diagnosis not present

## 2020-09-26 DIAGNOSIS — M810 Age-related osteoporosis without current pathological fracture: Secondary | ICD-10-CM | POA: Diagnosis not present

## 2020-09-26 DIAGNOSIS — H5310 Unspecified subjective visual disturbances: Secondary | ICD-10-CM | POA: Diagnosis not present

## 2020-09-26 DIAGNOSIS — I700 Atherosclerosis of aorta: Secondary | ICD-10-CM | POA: Diagnosis not present

## 2020-09-26 DIAGNOSIS — E785 Hyperlipidemia, unspecified: Secondary | ICD-10-CM | POA: Diagnosis not present

## 2020-09-28 DIAGNOSIS — M47812 Spondylosis without myelopathy or radiculopathy, cervical region: Secondary | ICD-10-CM | POA: Diagnosis not present

## 2020-09-28 DIAGNOSIS — K5790 Diverticulosis of intestine, part unspecified, without perforation or abscess without bleeding: Secondary | ICD-10-CM | POA: Diagnosis not present

## 2020-09-28 DIAGNOSIS — I119 Hypertensive heart disease without heart failure: Secondary | ICD-10-CM | POA: Diagnosis not present

## 2020-09-28 DIAGNOSIS — H5310 Unspecified subjective visual disturbances: Secondary | ICD-10-CM | POA: Diagnosis not present

## 2020-09-28 DIAGNOSIS — I700 Atherosclerosis of aorta: Secondary | ICD-10-CM | POA: Diagnosis not present

## 2020-09-28 DIAGNOSIS — E785 Hyperlipidemia, unspecified: Secondary | ICD-10-CM | POA: Diagnosis not present

## 2020-09-28 DIAGNOSIS — I081 Rheumatic disorders of both mitral and tricuspid valves: Secondary | ICD-10-CM | POA: Diagnosis not present

## 2020-09-28 DIAGNOSIS — M810 Age-related osteoporosis without current pathological fracture: Secondary | ICD-10-CM | POA: Diagnosis not present

## 2020-09-28 DIAGNOSIS — M5030 Other cervical disc degeneration, unspecified cervical region: Secondary | ICD-10-CM | POA: Diagnosis not present

## 2020-10-02 DIAGNOSIS — H43813 Vitreous degeneration, bilateral: Secondary | ICD-10-CM | POA: Diagnosis not present

## 2020-10-02 DIAGNOSIS — H3562 Retinal hemorrhage, left eye: Secondary | ICD-10-CM | POA: Diagnosis not present

## 2020-10-02 DIAGNOSIS — H35373 Puckering of macula, bilateral: Secondary | ICD-10-CM | POA: Diagnosis not present

## 2020-10-05 DIAGNOSIS — K5790 Diverticulosis of intestine, part unspecified, without perforation or abscess without bleeding: Secondary | ICD-10-CM | POA: Diagnosis not present

## 2020-10-05 DIAGNOSIS — I119 Hypertensive heart disease without heart failure: Secondary | ICD-10-CM | POA: Diagnosis not present

## 2020-10-05 DIAGNOSIS — M47812 Spondylosis without myelopathy or radiculopathy, cervical region: Secondary | ICD-10-CM | POA: Diagnosis not present

## 2020-10-05 DIAGNOSIS — I700 Atherosclerosis of aorta: Secondary | ICD-10-CM | POA: Diagnosis not present

## 2020-10-05 DIAGNOSIS — M810 Age-related osteoporosis without current pathological fracture: Secondary | ICD-10-CM | POA: Diagnosis not present

## 2020-10-05 DIAGNOSIS — H5310 Unspecified subjective visual disturbances: Secondary | ICD-10-CM | POA: Diagnosis not present

## 2020-10-05 DIAGNOSIS — E785 Hyperlipidemia, unspecified: Secondary | ICD-10-CM | POA: Diagnosis not present

## 2020-10-05 DIAGNOSIS — M5030 Other cervical disc degeneration, unspecified cervical region: Secondary | ICD-10-CM | POA: Diagnosis not present

## 2020-10-05 DIAGNOSIS — I081 Rheumatic disorders of both mitral and tricuspid valves: Secondary | ICD-10-CM | POA: Diagnosis not present

## 2020-10-09 DIAGNOSIS — E7800 Pure hypercholesterolemia, unspecified: Secondary | ICD-10-CM | POA: Diagnosis not present

## 2020-10-09 DIAGNOSIS — D509 Iron deficiency anemia, unspecified: Secondary | ICD-10-CM | POA: Diagnosis not present

## 2020-10-09 DIAGNOSIS — M1990 Unspecified osteoarthritis, unspecified site: Secondary | ICD-10-CM | POA: Diagnosis not present

## 2020-10-09 DIAGNOSIS — G4700 Insomnia, unspecified: Secondary | ICD-10-CM | POA: Diagnosis not present

## 2020-10-09 DIAGNOSIS — I119 Hypertensive heart disease without heart failure: Secondary | ICD-10-CM | POA: Diagnosis not present

## 2020-10-09 DIAGNOSIS — M810 Age-related osteoporosis without current pathological fracture: Secondary | ICD-10-CM | POA: Diagnosis not present

## 2020-10-09 DIAGNOSIS — E785 Hyperlipidemia, unspecified: Secondary | ICD-10-CM | POA: Diagnosis not present

## 2020-10-09 DIAGNOSIS — G459 Transient cerebral ischemic attack, unspecified: Secondary | ICD-10-CM | POA: Diagnosis not present

## 2020-10-12 DIAGNOSIS — E785 Hyperlipidemia, unspecified: Secondary | ICD-10-CM | POA: Diagnosis not present

## 2020-10-12 DIAGNOSIS — M47812 Spondylosis without myelopathy or radiculopathy, cervical region: Secondary | ICD-10-CM | POA: Diagnosis not present

## 2020-10-12 DIAGNOSIS — I700 Atherosclerosis of aorta: Secondary | ICD-10-CM | POA: Diagnosis not present

## 2020-10-12 DIAGNOSIS — K5790 Diverticulosis of intestine, part unspecified, without perforation or abscess without bleeding: Secondary | ICD-10-CM | POA: Diagnosis not present

## 2020-10-12 DIAGNOSIS — I081 Rheumatic disorders of both mitral and tricuspid valves: Secondary | ICD-10-CM | POA: Diagnosis not present

## 2020-10-12 DIAGNOSIS — H5310 Unspecified subjective visual disturbances: Secondary | ICD-10-CM | POA: Diagnosis not present

## 2020-10-12 DIAGNOSIS — I119 Hypertensive heart disease without heart failure: Secondary | ICD-10-CM | POA: Diagnosis not present

## 2020-10-12 DIAGNOSIS — M810 Age-related osteoporosis without current pathological fracture: Secondary | ICD-10-CM | POA: Diagnosis not present

## 2020-10-12 DIAGNOSIS — M5030 Other cervical disc degeneration, unspecified cervical region: Secondary | ICD-10-CM | POA: Diagnosis not present

## 2020-10-16 DIAGNOSIS — H35373 Puckering of macula, bilateral: Secondary | ICD-10-CM | POA: Diagnosis not present

## 2020-10-16 DIAGNOSIS — H3562 Retinal hemorrhage, left eye: Secondary | ICD-10-CM | POA: Diagnosis not present

## 2020-10-16 DIAGNOSIS — Z961 Presence of intraocular lens: Secondary | ICD-10-CM | POA: Diagnosis not present

## 2020-10-16 DIAGNOSIS — H43813 Vitreous degeneration, bilateral: Secondary | ICD-10-CM | POA: Diagnosis not present

## 2020-10-19 DIAGNOSIS — M47812 Spondylosis without myelopathy or radiculopathy, cervical region: Secondary | ICD-10-CM | POA: Diagnosis not present

## 2020-10-19 DIAGNOSIS — E785 Hyperlipidemia, unspecified: Secondary | ICD-10-CM | POA: Diagnosis not present

## 2020-10-19 DIAGNOSIS — M5030 Other cervical disc degeneration, unspecified cervical region: Secondary | ICD-10-CM | POA: Diagnosis not present

## 2020-10-19 DIAGNOSIS — K5790 Diverticulosis of intestine, part unspecified, without perforation or abscess without bleeding: Secondary | ICD-10-CM | POA: Diagnosis not present

## 2020-10-19 DIAGNOSIS — I081 Rheumatic disorders of both mitral and tricuspid valves: Secondary | ICD-10-CM | POA: Diagnosis not present

## 2020-10-19 DIAGNOSIS — I119 Hypertensive heart disease without heart failure: Secondary | ICD-10-CM | POA: Diagnosis not present

## 2020-10-19 DIAGNOSIS — M810 Age-related osteoporosis without current pathological fracture: Secondary | ICD-10-CM | POA: Diagnosis not present

## 2020-10-19 DIAGNOSIS — H5310 Unspecified subjective visual disturbances: Secondary | ICD-10-CM | POA: Diagnosis not present

## 2020-10-19 DIAGNOSIS — I700 Atherosclerosis of aorta: Secondary | ICD-10-CM | POA: Diagnosis not present

## 2020-10-21 DIAGNOSIS — D509 Iron deficiency anemia, unspecified: Secondary | ICD-10-CM | POA: Diagnosis not present

## 2020-10-21 DIAGNOSIS — Z8673 Personal history of transient ischemic attack (TIA), and cerebral infarction without residual deficits: Secondary | ICD-10-CM | POA: Diagnosis not present

## 2020-10-23 ENCOUNTER — Telehealth: Payer: Self-pay | Admitting: Neurology

## 2020-10-23 DIAGNOSIS — D509 Iron deficiency anemia, unspecified: Secondary | ICD-10-CM | POA: Diagnosis not present

## 2020-10-23 NOTE — Telephone Encounter (Signed)
Pt called stating she is on blood thinners and is bleeding from the eye and it has not stopped. Pt would like to know if provider thinks she should get off of her blood thinner medication. Pt states she is scared and she was advised that if the bleeding has not stopped she should go to the ED. Pt refuses to go to the ED because she would like to know what the provider thinks. Please advise.

## 2020-10-23 NOTE — Telephone Encounter (Signed)
I tried to call the patient.  Could not reach her on her cell phone or at home.  I am not sure what she is talking about with bleeding from the eyes.  It sounds as if she is planning on going to the emergency room.  I will check back later to see if I can reach her.

## 2020-10-23 NOTE — Telephone Encounter (Signed)
Returned patient back.  Stated this has been going on since Monday.  She is bleeding from left eye, right eye has stopped bleeding.  However, this has been going on and off for days. I explained risks of driving herself including losing vision, passing out, having an accident and hurting someone else.   She asked what was wrong with her and explained I couldn't diagnose her, and she would need definitive tests run.  She thought if she just stopped taking plavix she would be okay.  Discussed risks of not going.  Patient agreed to go to Emergency room.

## 2020-10-25 DIAGNOSIS — I119 Hypertensive heart disease without heart failure: Secondary | ICD-10-CM | POA: Diagnosis not present

## 2020-10-25 DIAGNOSIS — M47812 Spondylosis without myelopathy or radiculopathy, cervical region: Secondary | ICD-10-CM | POA: Diagnosis not present

## 2020-10-25 DIAGNOSIS — H5310 Unspecified subjective visual disturbances: Secondary | ICD-10-CM | POA: Diagnosis not present

## 2020-10-25 DIAGNOSIS — M810 Age-related osteoporosis without current pathological fracture: Secondary | ICD-10-CM | POA: Diagnosis not present

## 2020-10-25 DIAGNOSIS — M5030 Other cervical disc degeneration, unspecified cervical region: Secondary | ICD-10-CM | POA: Diagnosis not present

## 2020-10-25 DIAGNOSIS — I081 Rheumatic disorders of both mitral and tricuspid valves: Secondary | ICD-10-CM | POA: Diagnosis not present

## 2020-10-25 DIAGNOSIS — E785 Hyperlipidemia, unspecified: Secondary | ICD-10-CM | POA: Diagnosis not present

## 2020-10-25 DIAGNOSIS — K5790 Diverticulosis of intestine, part unspecified, without perforation or abscess without bleeding: Secondary | ICD-10-CM | POA: Diagnosis not present

## 2020-10-25 DIAGNOSIS — I700 Atherosclerosis of aorta: Secondary | ICD-10-CM | POA: Diagnosis not present

## 2020-10-25 NOTE — Telephone Encounter (Signed)
I called the patient again, unable to reach her, she has been seen in the past for a retinal hemorrhage on the right, not sure what she is talking about in terms of bleeding from the eyes.,  She never went to the emergency room.

## 2020-10-27 NOTE — Telephone Encounter (Signed)
I called the patient and finally was able to reach her.  She indicates that she has had some issues with retinal hemorrhage, she also has had chronic blood loss with iron deficiency anemia, a recent Hemoccult test was positive.  The patient will be seeing her gastroenterologist in the near future, she does have a prior history of peptic ulcer issues.  It may be reasonable be off of Plavix for now given these 2 issues.

## 2020-11-04 DIAGNOSIS — H5310 Unspecified subjective visual disturbances: Secondary | ICD-10-CM | POA: Diagnosis not present

## 2020-11-04 DIAGNOSIS — M5030 Other cervical disc degeneration, unspecified cervical region: Secondary | ICD-10-CM | POA: Diagnosis not present

## 2020-11-04 DIAGNOSIS — M810 Age-related osteoporosis without current pathological fracture: Secondary | ICD-10-CM | POA: Diagnosis not present

## 2020-11-04 DIAGNOSIS — E785 Hyperlipidemia, unspecified: Secondary | ICD-10-CM | POA: Diagnosis not present

## 2020-11-04 DIAGNOSIS — M47812 Spondylosis without myelopathy or radiculopathy, cervical region: Secondary | ICD-10-CM | POA: Diagnosis not present

## 2020-11-04 DIAGNOSIS — I700 Atherosclerosis of aorta: Secondary | ICD-10-CM | POA: Diagnosis not present

## 2020-11-04 DIAGNOSIS — I081 Rheumatic disorders of both mitral and tricuspid valves: Secondary | ICD-10-CM | POA: Diagnosis not present

## 2020-11-04 DIAGNOSIS — I119 Hypertensive heart disease without heart failure: Secondary | ICD-10-CM | POA: Diagnosis not present

## 2020-11-04 DIAGNOSIS — K5790 Diverticulosis of intestine, part unspecified, without perforation or abscess without bleeding: Secondary | ICD-10-CM | POA: Diagnosis not present

## 2020-11-08 DIAGNOSIS — D509 Iron deficiency anemia, unspecified: Secondary | ICD-10-CM | POA: Diagnosis not present

## 2020-11-08 DIAGNOSIS — Z8673 Personal history of transient ischemic attack (TIA), and cerebral infarction without residual deficits: Secondary | ICD-10-CM | POA: Diagnosis not present

## 2020-11-08 DIAGNOSIS — K2970 Gastritis, unspecified, without bleeding: Secondary | ICD-10-CM | POA: Diagnosis not present

## 2020-11-08 DIAGNOSIS — Z86010 Personal history of colonic polyps: Secondary | ICD-10-CM | POA: Diagnosis not present

## 2020-11-08 DIAGNOSIS — D132 Benign neoplasm of duodenum: Secondary | ICD-10-CM | POA: Diagnosis not present

## 2020-11-11 DIAGNOSIS — H3562 Retinal hemorrhage, left eye: Secondary | ICD-10-CM | POA: Diagnosis not present

## 2020-11-13 ENCOUNTER — Institutional Professional Consult (permissible substitution): Payer: Medicare HMO | Admitting: Neurology

## 2020-11-27 DIAGNOSIS — Z961 Presence of intraocular lens: Secondary | ICD-10-CM | POA: Diagnosis not present

## 2020-11-27 DIAGNOSIS — H3562 Retinal hemorrhage, left eye: Secondary | ICD-10-CM | POA: Diagnosis not present

## 2020-11-27 DIAGNOSIS — H35373 Puckering of macula, bilateral: Secondary | ICD-10-CM | POA: Diagnosis not present

## 2020-11-27 DIAGNOSIS — H43813 Vitreous degeneration, bilateral: Secondary | ICD-10-CM | POA: Diagnosis not present

## 2020-12-11 DIAGNOSIS — M1990 Unspecified osteoarthritis, unspecified site: Secondary | ICD-10-CM | POA: Diagnosis not present

## 2020-12-11 DIAGNOSIS — I119 Hypertensive heart disease without heart failure: Secondary | ICD-10-CM | POA: Diagnosis not present

## 2020-12-11 DIAGNOSIS — E7800 Pure hypercholesterolemia, unspecified: Secondary | ICD-10-CM | POA: Diagnosis not present

## 2020-12-11 DIAGNOSIS — E785 Hyperlipidemia, unspecified: Secondary | ICD-10-CM | POA: Diagnosis not present

## 2020-12-11 DIAGNOSIS — M810 Age-related osteoporosis without current pathological fracture: Secondary | ICD-10-CM | POA: Diagnosis not present

## 2020-12-11 DIAGNOSIS — G459 Transient cerebral ischemic attack, unspecified: Secondary | ICD-10-CM | POA: Diagnosis not present

## 2020-12-11 DIAGNOSIS — M47812 Spondylosis without myelopathy or radiculopathy, cervical region: Secondary | ICD-10-CM | POA: Diagnosis not present

## 2020-12-11 DIAGNOSIS — G4700 Insomnia, unspecified: Secondary | ICD-10-CM | POA: Diagnosis not present

## 2020-12-11 DIAGNOSIS — D509 Iron deficiency anemia, unspecified: Secondary | ICD-10-CM | POA: Diagnosis not present

## 2020-12-20 DIAGNOSIS — D509 Iron deficiency anemia, unspecified: Secondary | ICD-10-CM | POA: Diagnosis not present

## 2021-01-08 ENCOUNTER — Other Ambulatory Visit: Payer: Self-pay | Admitting: Neurology

## 2021-01-15 DIAGNOSIS — Z961 Presence of intraocular lens: Secondary | ICD-10-CM | POA: Diagnosis not present

## 2021-01-15 DIAGNOSIS — H35373 Puckering of macula, bilateral: Secondary | ICD-10-CM | POA: Diagnosis not present

## 2021-01-15 DIAGNOSIS — H3562 Retinal hemorrhage, left eye: Secondary | ICD-10-CM | POA: Diagnosis not present

## 2021-01-15 DIAGNOSIS — H43813 Vitreous degeneration, bilateral: Secondary | ICD-10-CM | POA: Diagnosis not present

## 2021-01-20 DIAGNOSIS — D509 Iron deficiency anemia, unspecified: Secondary | ICD-10-CM | POA: Diagnosis not present

## 2021-01-22 ENCOUNTER — Encounter: Payer: Self-pay | Admitting: Neurology

## 2021-01-22 ENCOUNTER — Ambulatory Visit: Payer: Medicare HMO | Admitting: Neurology

## 2021-01-22 VITALS — BP 150/74 | HR 91 | Ht <= 58 in | Wt 90.6 lb

## 2021-01-22 DIAGNOSIS — G459 Transient cerebral ischemic attack, unspecified: Secondary | ICD-10-CM | POA: Diagnosis not present

## 2021-01-22 NOTE — Progress Notes (Signed)
Reason for visit: Cerebrovascular disease  Jill Pitts is an 87 y.o. female  History of present illness:  Jill Pitts is an 87 year old right-handed white female with a history of episodes of visual disturbance that have been occurring over 3 decades.  The patient would have some visual loss off to the right lasting 30 to 40 minutes and then have resolution of this with a mild left frontotemporal headache afterwards.  The patient has undergone a complete stroke work-up on 24 August 2020 when the patient presented with an episode of confusion and slurring of speech.  The work-up was relatively unremarkable.  The patient has been treated with Plavix, but she was taken off of Plavix several months ago as she was having significant issues with GI bleed from the stomach as well as from the colon, and she was having some problems with retinal hemorrhages.  The patient has been doing well until recently, on 11 Jan 2021, she had a 10-minute episode of left face and left arm numbness without weakness.  She had no visual disturbance at that point.  She reported no speech alteration or troubles with balance.  She had full resolution of symptoms, she has done well since that time.  She does complain of some left-sided low back pain.  Past Medical History:  Diagnosis Date  . Degenerative disc disease, cervical   . Diverticulitis   . Eczema   . Headache 10/25/2014  . Hyperlipidemia   . Osteoarthritis   . Osteoporosis   . Seborrheic dermatitis   . Sinusitis   . Stroke ([place]) 08/25/2019  . Subjective visual disturbance 10/25/2014  . TIA (transient ischemic attack)   . Tinnitus     Past Surgical History:  Procedure Laterality Date  . ABDOMINAL HYSTERECTOMY    . APPENDECTOMY  1981  . BIOPSY  09/25/2019   Procedure: BIOPSY;  Surgeon: Ronald Lobo, MD;  Location: [place];  Service: Endoscopy;;  . CARPAL TUNNEL RELEASE Right 1998  . CATARACT EXTRACTION Bilateral   . COLONOSCOPY WITH PROPOFOL N/A  09/25/2019   Procedure: COLONOSCOPY WITH PROPOFOL;  Surgeon: Ronald Lobo, MD;  Location: [place];  Service: Endoscopy;  Laterality: N/A;  . FLEXIBLE SIGMOIDOSCOPY N/A 09/27/2019   Procedure: FLEXIBLE SIGMOIDOSCOPY;  Surgeon: Ronald Lobo, MD;  Location: [facility] ENDOSCOPY;  Service: Endoscopy;  Laterality: N/A;  . NASAL POLYP SURGERY    . POLYPECTOMY  09/27/2019   Procedure: POLYPECTOMY;  Surgeon: Ronald Lobo, MD;  Location: [place];  Service: Endoscopy;;  . SUBMUCOSAL LIFTING INJECTION  09/27/2019   Procedure: SUBMUCOSAL LIFTING INJECTION;  Surgeon: Ronald Lobo, MD;  Location: [place];  Service: Endoscopy;;  . SUBMUCOSAL TATTOO INJECTION  09/27/2019   Procedure: SUBMUCOSAL TATTOO INJECTION;  Surgeon: Ronald Lobo, MD;  Location[facility] ENDOSCOPY;  Service: Endoscopy;;  . TONSILLECTOMY AND ADENOIDECTOMY      Family History  Problem Relation Age of Onset  . Heart disease Father   . Prostate cancer Father   . Asthma Sister   . Hypertension Sister   . Diabetes Sister   . Stroke Brother     Social history:  reports that she has quit smoking. She has never used smokeless tobacco. She reports current alcohol use. She reports that she does not use drugs.    Allergies  Allergen Reactions  . Gabapentin     Tachycardia  . Keppra [Levetiracetam] Other (See Comments)    Hallucinations    Medications:  Prior to Admission medications   Medication Sig Start  Date End Date Taking? Authorizing Provider  Ascorbic Acid (VITAMIN C) 500 MG CHEW Chew 500 mg by mouth daily.    [provider]  Biotin 1 MG CAPS Take 1 mg by mouth daily. 09/28/19   Barb Merino, MD  Calcium Carb-Cholecalciferol (CALCIUM + D3) 600-200 MG-UNIT TABS Take 1 tablet by mouth 2 (two) times daily.    [provider]  clopidogrel (PLAVIX) 75 MG tablet Take 75 mg by mouth daily.    [provider]  Co-Enzyme Q-10 30 MG CAPS Take 30 mg by mouth.    [provider]  ferrous  sulfate 325 (65 FE) MG tablet Take 325 mg by mouth daily.    [provider]  Fish Oil OIL Take 1,400 mg by mouth 2 (two) times daily.    [provider]  lovastatin (MEVACOR) 20 MG tablet Take 20 mg by mouth daily. 08/28/14   [provider]  metoprolol succinate (TOPROL-XL) 25 MG 24 hr tablet TAKE 1 TABLET (25 MG TOTAL) BY MOUTH DAILY. 01/08/21   Kathrynn Ducking, MD  Multiple Vitamins-Minerals (MULTIVITAMIN ADULT PO) Take 1 tablet by mouth daily.    [provider]  triamcinolone (KENALOG) 0.1 % Apply 1 application topically daily. 06/06/20   [provider]    ROS:  Out of a complete 14 system review of symptoms, the patient complains only of the following symptoms, and all other reviewed systems are negative.  Transient numbness Low back pain  Blood pressure (!) 150/74, pulse 91, height 4\' 10"  (1.473 m), weight 90 lb 9.6 oz (41.1 kg).  Physical Exam  General: The patient is alert and cooperative at the time of the examination.  Skin: No significant peripheral edema is noted.   Neurologic Exam  Mental status: The patient is alert and oriented x 3 at the time of the examination. The patient has apparent normal recent and remote memory, with an apparently normal attention span and concentration ability.   Cranial nerves: Facial symmetry is present. Speech is normal, no aphasia or dysarthria is noted. Extraocular movements are full. Visual fields are full.  Motor: The patient has good strength in all 4 extremities.  Sensory examination: Soft touch sensation is symmetric on the face, arms, and legs.  Coordination: The patient has good finger-nose-finger and heel-to-shin bilaterally.  Gait and station: The patient has a normal gait. Romberg is negative. No drift is seen.  Reflexes: Deep tendon reflexes are symmetric.    MRI brain 08/24/20:  IMPRESSION: No acute infarction, hemorrhage, or mass.  Stable advanced chronic  microvascular ischemic changes.  Similar appearance of probable sinonasal polyposis.  * MRI scan images were reviewed online. I agree with the written report.    Assessment/Plan:  1.  Cerebrovascular disease, possible recent TIA  2.  Transient visual episodes, possible migraine  The patient has been off of Plavix for several months, she had a transient sensory but it could have represented a TIA.  She has had problems with retinal hemorrhages and GI bleed from peptic ulcer disease as well as from diverticulitis.  It is not clear that she can go back on an antiplatelet agent at this point.  I will try to contact her gastroenterologist to see if possibly low-dose of aspirin, 81 mg enteric-coated 3 times a week could be used.  The patient otherwise will follow up in 6 months.  Addendum: I have communicated with Dr. Cristina Gong from gastroenterology, he has indicated that using 81 mg of aspirin, enteric-coated, 3  times a week would be acceptable and safe in this patient.  Jill Alexanders MD 01/22/2021 2:03 PM  [facility] [address] [place], [place] [address]  Phone [phone] Fax [phone]

## 2021-02-25 DIAGNOSIS — M1990 Unspecified osteoarthritis, unspecified site: Secondary | ICD-10-CM | POA: Diagnosis not present

## 2021-02-25 DIAGNOSIS — M47812 Spondylosis without myelopathy or radiculopathy, cervical region: Secondary | ICD-10-CM | POA: Diagnosis not present

## 2021-02-25 DIAGNOSIS — M810 Age-related osteoporosis without current pathological fracture: Secondary | ICD-10-CM | POA: Diagnosis not present

## 2021-02-25 DIAGNOSIS — E785 Hyperlipidemia, unspecified: Secondary | ICD-10-CM | POA: Diagnosis not present

## 2021-02-25 DIAGNOSIS — G459 Transient cerebral ischemic attack, unspecified: Secondary | ICD-10-CM | POA: Diagnosis not present

## 2021-02-25 DIAGNOSIS — E7800 Pure hypercholesterolemia, unspecified: Secondary | ICD-10-CM | POA: Diagnosis not present

## 2021-02-25 DIAGNOSIS — G4700 Insomnia, unspecified: Secondary | ICD-10-CM | POA: Diagnosis not present

## 2021-02-25 DIAGNOSIS — I119 Hypertensive heart disease without heart failure: Secondary | ICD-10-CM | POA: Diagnosis not present

## 2021-02-25 DIAGNOSIS — D509 Iron deficiency anemia, unspecified: Secondary | ICD-10-CM | POA: Diagnosis not present

## 2021-03-17 DIAGNOSIS — H02831 Dermatochalasis of right upper eyelid: Secondary | ICD-10-CM | POA: Diagnosis not present

## 2021-03-17 DIAGNOSIS — H0100B Unspecified blepharitis left eye, upper and lower eyelids: Secondary | ICD-10-CM | POA: Diagnosis not present

## 2021-03-17 DIAGNOSIS — H02834 Dermatochalasis of left upper eyelid: Secondary | ICD-10-CM | POA: Diagnosis not present

## 2021-03-17 DIAGNOSIS — H26492 Other secondary cataract, left eye: Secondary | ICD-10-CM | POA: Diagnosis not present

## 2021-03-17 DIAGNOSIS — H0288B Meibomian gland dysfunction left eye, upper and lower eyelids: Secondary | ICD-10-CM | POA: Diagnosis not present

## 2021-03-17 DIAGNOSIS — H0100A Unspecified blepharitis right eye, upper and lower eyelids: Secondary | ICD-10-CM | POA: Diagnosis not present

## 2021-03-17 DIAGNOSIS — H35373 Puckering of macula, bilateral: Secondary | ICD-10-CM | POA: Diagnosis not present

## 2021-03-17 DIAGNOSIS — H0288A Meibomian gland dysfunction right eye, upper and lower eyelids: Secondary | ICD-10-CM | POA: Diagnosis not present

## 2021-03-17 DIAGNOSIS — H3562 Retinal hemorrhage, left eye: Secondary | ICD-10-CM | POA: Diagnosis not present

## 2021-04-16 DIAGNOSIS — G4700 Insomnia, unspecified: Secondary | ICD-10-CM | POA: Diagnosis not present

## 2021-04-16 DIAGNOSIS — M47812 Spondylosis without myelopathy or radiculopathy, cervical region: Secondary | ICD-10-CM | POA: Diagnosis not present

## 2021-04-16 DIAGNOSIS — I119 Hypertensive heart disease without heart failure: Secondary | ICD-10-CM | POA: Diagnosis not present

## 2021-04-16 DIAGNOSIS — M810 Age-related osteoporosis without current pathological fracture: Secondary | ICD-10-CM | POA: Diagnosis not present

## 2021-04-16 DIAGNOSIS — E7800 Pure hypercholesterolemia, unspecified: Secondary | ICD-10-CM | POA: Diagnosis not present

## 2021-04-16 DIAGNOSIS — G459 Transient cerebral ischemic attack, unspecified: Secondary | ICD-10-CM | POA: Diagnosis not present

## 2021-04-16 DIAGNOSIS — E785 Hyperlipidemia, unspecified: Secondary | ICD-10-CM | POA: Diagnosis not present

## 2021-04-16 DIAGNOSIS — D509 Iron deficiency anemia, unspecified: Secondary | ICD-10-CM | POA: Diagnosis not present

## 2021-04-16 DIAGNOSIS — M1990 Unspecified osteoarthritis, unspecified site: Secondary | ICD-10-CM | POA: Diagnosis not present

## 2021-04-23 DIAGNOSIS — E611 Iron deficiency: Secondary | ICD-10-CM | POA: Diagnosis not present

## 2021-05-02 DIAGNOSIS — M810 Age-related osteoporosis without current pathological fracture: Secondary | ICD-10-CM | POA: Diagnosis not present

## 2021-05-02 DIAGNOSIS — E7800 Pure hypercholesterolemia, unspecified: Secondary | ICD-10-CM | POA: Diagnosis not present

## 2021-05-02 DIAGNOSIS — R5383 Other fatigue: Secondary | ICD-10-CM | POA: Diagnosis not present

## 2021-05-10 DIAGNOSIS — M1990 Unspecified osteoarthritis, unspecified site: Secondary | ICD-10-CM | POA: Diagnosis not present

## 2021-05-10 DIAGNOSIS — E7800 Pure hypercholesterolemia, unspecified: Secondary | ICD-10-CM | POA: Diagnosis not present

## 2021-05-10 DIAGNOSIS — M47812 Spondylosis without myelopathy or radiculopathy, cervical region: Secondary | ICD-10-CM | POA: Diagnosis not present

## 2021-05-10 DIAGNOSIS — G459 Transient cerebral ischemic attack, unspecified: Secondary | ICD-10-CM | POA: Diagnosis not present

## 2021-05-10 DIAGNOSIS — I119 Hypertensive heart disease without heart failure: Secondary | ICD-10-CM | POA: Diagnosis not present

## 2021-05-10 DIAGNOSIS — E785 Hyperlipidemia, unspecified: Secondary | ICD-10-CM | POA: Diagnosis not present

## 2021-05-10 DIAGNOSIS — M810 Age-related osteoporosis without current pathological fracture: Secondary | ICD-10-CM | POA: Diagnosis not present

## 2021-05-10 DIAGNOSIS — G4700 Insomnia, unspecified: Secondary | ICD-10-CM | POA: Diagnosis not present

## 2021-05-21 DIAGNOSIS — Z961 Presence of intraocular lens: Secondary | ICD-10-CM | POA: Diagnosis not present

## 2021-05-21 DIAGNOSIS — H3562 Retinal hemorrhage, left eye: Secondary | ICD-10-CM | POA: Diagnosis not present

## 2021-05-21 DIAGNOSIS — H35373 Puckering of macula, bilateral: Secondary | ICD-10-CM | POA: Diagnosis not present

## 2021-05-21 DIAGNOSIS — H31002 Unspecified chorioretinal scars, left eye: Secondary | ICD-10-CM | POA: Diagnosis not present

## 2021-05-21 DIAGNOSIS — H43813 Vitreous degeneration, bilateral: Secondary | ICD-10-CM | POA: Diagnosis not present

## 2021-06-06 DIAGNOSIS — Z1231 Encounter for screening mammogram for malignant neoplasm of breast: Secondary | ICD-10-CM | POA: Diagnosis not present

## 2021-06-09 DIAGNOSIS — M810 Age-related osteoporosis without current pathological fracture: Secondary | ICD-10-CM | POA: Diagnosis not present

## 2021-06-09 DIAGNOSIS — E7800 Pure hypercholesterolemia, unspecified: Secondary | ICD-10-CM | POA: Diagnosis not present

## 2021-06-09 DIAGNOSIS — I119 Hypertensive heart disease without heart failure: Secondary | ICD-10-CM | POA: Diagnosis not present

## 2021-06-09 DIAGNOSIS — Z0000 Encounter for general adult medical examination without abnormal findings: Secondary | ICD-10-CM | POA: Diagnosis not present

## 2021-06-09 DIAGNOSIS — K279 Peptic ulcer, site unspecified, unspecified as acute or chronic, without hemorrhage or perforation: Secondary | ICD-10-CM | POA: Diagnosis not present

## 2021-06-09 DIAGNOSIS — M545 Low back pain, unspecified: Secondary | ICD-10-CM | POA: Diagnosis not present

## 2021-06-09 DIAGNOSIS — M1990 Unspecified osteoarthritis, unspecified site: Secondary | ICD-10-CM | POA: Diagnosis not present

## 2021-06-09 DIAGNOSIS — I700 Atherosclerosis of aorta: Secondary | ICD-10-CM | POA: Diagnosis not present

## 2021-06-09 DIAGNOSIS — Z8673 Personal history of transient ischemic attack (TIA), and cerebral infarction without residual deficits: Secondary | ICD-10-CM | POA: Diagnosis not present

## 2021-07-10 ENCOUNTER — Other Ambulatory Visit: Payer: Self-pay | Admitting: Neurology

## 2021-07-29 DIAGNOSIS — M5416 Radiculopathy, lumbar region: Secondary | ICD-10-CM | POA: Diagnosis not present

## 2021-07-29 DIAGNOSIS — M4856XD Collapsed vertebra, not elsewhere classified, lumbar region, subsequent encounter for fracture with routine healing: Secondary | ICD-10-CM | POA: Diagnosis not present

## 2021-07-29 DIAGNOSIS — M533 Sacrococcygeal disorders, not elsewhere classified: Secondary | ICD-10-CM | POA: Diagnosis not present

## 2021-07-30 ENCOUNTER — Encounter: Payer: Self-pay | Admitting: Neurology

## 2021-07-30 ENCOUNTER — Ambulatory Visit: Payer: Medicare HMO | Admitting: Neurology

## 2021-07-30 VITALS — BP 189/78 | HR 81 | Ht <= 58 in | Wt 87.0 lb

## 2021-07-30 DIAGNOSIS — Z8673 Personal history of transient ischemic attack (TIA), and cerebral infarction without residual deficits: Secondary | ICD-10-CM | POA: Diagnosis not present

## 2021-07-30 DIAGNOSIS — G43009 Migraine without aura, not intractable, without status migrainosus: Secondary | ICD-10-CM | POA: Diagnosis not present

## 2021-07-30 NOTE — Patient Instructions (Signed)
I had a long discussion with the patient and her son regarding her episode of TIA and discussed secondary prevention strategies and recommend she stay on aspirin for stroke prevention and maintain aggressive risk factor modification with strict control of hypertension with blood pressure goal below 140/90, lipids with LDL cholesterol goal below 70 mg percent and diabetes with hemoglobin A1c goal below 6.5%.  She was also advised to get up slowly and avoid sudden movements.  We also discussed fall safety precautions.  She will return for follow-up in the future only as needed and routine schedule appointment was made. Fall Prevention in the Home, Adult Falls can cause injuries and can happen to people of all ages. There are many things you can do to make your home safe and to help prevent falls. Ask for help when making these changes. What actions can I take to prevent falls? General Instructions Use good lighting in all rooms. Replace any light bulbs that burn out. Turn on the lights in dark areas. Use night-lights. Keep items that you use often in easy-to-reach places. Lower the shelves around your home if needed. Set up your furniture so you have a clear path. Avoid moving your furniture around. Do not have throw rugs or other things on the floor that can make you trip. Avoid walking on wet floors. If any of your floors are uneven, fix them. Add color or contrast paint or tape to clearly mark and help you see: Grab bars or handrails. First and last steps of staircases. Where the edge of each step is. If you use a stepladder: Make sure that it is fully opened. Do not climb a closed stepladder. Make sure the sides of the stepladder are locked in place. Ask someone to hold the stepladder while you use it. Know where your pets are when moving through your home. What can I do in the bathroom?   Keep the floor dry. Clean up any water on the floor right away. Remove soap buildup in the tub or  shower. Use nonskid mats or decals on the floor of the tub or shower. Attach bath mats securely with double-sided, nonslip rug tape. If you need to sit down in the shower, use a plastic, nonslip stool. Install grab bars by the toilet and in the tub and shower. Do not use towel bars as grab bars. What can I do in the bedroom? Make sure that you have a light by your bed that is easy to reach. Do not use any sheets or blankets for your bed that hang to the floor. Have a firm chair with side arms that you can use for support when you get dressed. What can I do in the kitchen? Clean up any spills right away. If you need to reach something above you, use a step stool with a grab bar. Keep electrical cords out of the way. Do not use floor polish or wax that makes floors slippery. What can I do with my stairs? Do not leave any items on the stairs. Make sure that you have a light switch at the top and the bottom of the stairs. Make sure that there are handrails on both sides of the stairs. Fix handrails that are broken or loose. Install nonslip stair treads on all your stairs. Avoid having throw rugs at the top or bottom of the stairs. Choose a carpet that does not hide the edge of the steps on the stairs. Check carpeting to make sure that it is  firmly attached to the stairs. Fix carpet that is loose or worn. What can I do on the outside of my home? Use bright outdoor lighting. Fix the edges of walkways and driveways and fix any cracks. Remove anything that might make you trip as you walk through a door, such as a raised step or threshold. Trim any bushes or trees on paths to your home. Check to see if handrails are loose or broken and that both sides of all steps have handrails. Install guardrails along the edges of any raised decks and porches. Clear paths of anything that can make you trip, such as tools or rocks. Have leaves, snow, or ice cleared regularly. Use sand or salt on paths during  winter. Clean up any spills in your garage right away. This includes grease or oil spills. What other actions can I take? Wear shoes that: Have a low heel. Do not wear high heels. Have rubber bottoms. Feel good on your feet and fit well. Are closed at the toe. Do not wear open-toe sandals. Use tools that help you move around if needed. These include: Canes. Walkers. Scooters. Crutches. Review your medicines with your doctor. Some medicines can make you feel dizzy. This can increase your chance of falling. Ask your doctor what else you can do to help prevent falls. Where to find more information Centers for Disease Control and Prevention, [employer]: http://www.wolf.info/ National Institute on Aging: http://kim-miller.com/ Contact a doctor if: You are afraid of falling at home. You feel weak, drowsy, or dizzy at home. You fall at home. Summary There are many simple things that you can do to make your home safe and to help prevent falls. Ways to make your home safe include removing things that can make you trip and installing grab bars in the bathroom. Ask for help when making these changes in your home. This information is not intended to replace advice given to you by your health care provider. Make sure you discuss any questions you have with your health care provider. Document Revised: 03/06/2020 Document Reviewed: 03/06/2020 Elsevier Patient Education  [place].

## 2021-07-30 NOTE — Progress Notes (Signed)
Reason for visit: Cerebrovascular disease  Jill Pitts is an 87 y.o. female  History of present illness:  Dr. Jannifer Franklin 01/22/2021 :Jill Pitts is an 87 year old right-handed white female with a history of episodes of visual disturbance that have been occurring over 3 decades.  The patient would have some visual loss off to the right lasting 30 to 40 minutes and then have resolution of this with a mild left frontotemporal headache afterwards.  The patient has undergone a complete stroke work-up on 24 August 2020 when the patient presented with an episode of confusion and slurring of speech.  The work-up was relatively unremarkable.  The patient has been treated with Plavix, but she was taken off of Plavix several months ago as she was having significant issues with GI bleed from the stomach as well as from the colon, and she was having some problems with retinal hemorrhages.  The patient has been doing well until recently, on 11 Jan 2021, she had a 10-minute episode of left face and left arm numbness without weakness.  She had no visual disturbance at that point.  She reported no speech alteration or troubles with balance.  She had full resolution of symptoms, she has done well since that time.  She does complain of some left-sided low back pain. Update 07/30/2021: Patient is seen by me today to establish neurological care after neurologist Dr. Jannifer Franklin retired.  Patient states she is done well since her last visit in 6 months.  She has had no further TIA or strokelike symptoms since January this year.  On 08/24/2020 MRI scan of the brain had shown no acute abnormality and only chronic small vessel disease changes.  CT angiogram of the brain and neck showed no large vessel stenosis or occlusion.  Echocardiogram had shown ejection fraction of 60 to 65% without cardiac source of embolism.  LDL cholesterol was 76 mg percent and hemoglobin A1c was 5.1.  She remains on aspirin 81 mg which she takes 3 times a week  because she had history of GI bleeding on Plavix in the past.  She is tolerating it well without bruising.  Blood pressure is usually well controlled at home though she does admit to whitecoat hypertension and today it is elevated in office at 189/78.  She remains on Mevacor which she is tolerating well without muscle aches and pains and states her last lab work by primary care physician showed fantastic lipids.  She has not had any episodes of vision loss or migraine-like episodes.  Several years.  He has no new complaints today.x Past Medical History:  Diagnosis Date   Degenerative disc disease, cervical    Diverticulitis    Eczema    Headache 10/25/2014   Hyperlipidemia    Osteoarthritis    Osteoporosis    Seborrheic dermatitis    Sinusitis    Stroke ([place]) 08/25/2019   Subjective visual disturbance 10/25/2014   TIA (transient ischemic attack)    Tinnitus     Past Surgical History:  Procedure Laterality Date   [place]   BIOPSY  09/25/2019   Procedure: BIOPSY;  Surgeon: Ronald Lobo, MD;  Location: [place];  Service: Endoscopy;;   CARPAL TUNNEL RELEASE Right 1998   CATARACT EXTRACTION Bilateral    COLONOSCOPY WITH PROPOFOL N/A 09/25/2019   Procedure: COLONOSCOPY WITH PROPOFOL;  Surgeon: Ronald Lobo, MD;  Location: [facility] ENDOSCOPY;  Service: Endoscopy;  Laterality: N/A;   FLEXIBLE SIGMOIDOSCOPY N/A 09/27/2019  Procedure: FLEXIBLE SIGMOIDOSCOPY;  Surgeon: Ronald Lobo, MD;  Location: [facility] ENDOSCOPY;  Service: Endoscopy;  Laterality: N/A;   NASAL POLYP SURGERY     POLYPECTOMY  09/27/2019   Procedure: POLYPECTOMY;  Surgeon: Ronald Lobo, MD;  Location: [place];  Service: Endoscopy;;   SUBMUCOSAL LIFTING INJECTION  09/27/2019   Procedure: SUBMUCOSAL LIFTING INJECTION;  Surgeon: Ronald Lobo, MD;  Location: [place];  Service: Endoscopy;;   SUBMUCOSAL TATTOO INJECTION  09/27/2019   Procedure: SUBMUCOSAL TATTOO INJECTION;  Surgeon:  Ronald Lobo, MD;  Location[facility] END[facility]COPY;  Service: Endoscopy;;   TONSILLECTOMY AND ADENOIDECTOMY      Family History  Problem Relation Age of Onset   Heart disease Father    Prostate cancer Father    Asthma Sister    Hypertension Sister    Diabetes Sister    Stroke Brother     Social history:  reports that she has quit smoking. She has never used smokeless tobacco. She reports current alcohol use. She reports that she does not use drugs.    Allergies  Allergen Reactions   Gabapentin     Tachycardia   Keppra [Levetiracetam] Other (See Comments)    Hallucinations    Medications:  Prior to Admission medications   Medication Sig Start Date End Date Taking? Authorizing Provider  Ascorbic Acid (VITAMIN C) 500 MG CHEW Chew 500 mg by mouth daily.    [provider]  Biotin 1 MG CAPS Take 1 mg by mouth daily. 09/28/19   Barb Merino, MD  Calcium Carb-Cholecalciferol (CALCIUM + D3) 600-200 MG-UNIT TABS Take 1 tablet by mouth 2 (two) times daily.    [provider]  clopidogrel (PLAVIX) 75 MG tablet Take 75 mg by mouth daily.    [provider]  Co-Enzyme Q-10 30 MG CAPS Take 30 mg by mouth.    [provider]  ferrous sulfate 325 (65 FE) MG tablet Take 325 mg by mouth daily.    [provider]  Fish Oil OIL Take 1,400 mg by mouth 2 (two) times daily.    [provider]  lovastatin (MEVACOR) 20 MG tablet Take 20 mg by mouth daily. 08/28/14   [provider]  metoprolol succinate (TOPROL-XL) 25 MG 24 hr tablet TAKE 1 TABLET (25 MG TOTAL) BY MOUTH DAILY. 01/08/21   Kathrynn Ducking, MD  Multiple Vitamins-Minerals (MULTIVITAMIN ADULT PO) Take 1 tablet by mouth daily.    [provider]  triamcinolone (KENALOG) 0.1 % Apply 1 application topically daily. 06/06/20   [provider]    ROS:  Out of a complete 14 system review of symptoms, the patient complains only of the following symptoms, and all other  reviewed systems are negative.  Transient numbness Low back pain  Blood pressure (!) 189/78, pulse 81, height 4\' 10"  (1.473 m), weight 87 lb (39.5 kg).  Physical Exam  General: Pleasant frail petite elderly Caucasian lady not in distress.  She has mild kyphoscoliosis Skin: No significant peripheral edema is noted.   Neurologic Exam     ;  Awake  Alert oriented x 3. Normal speech and language.eye movements full without nystagmus.fundi were not visualized. Vision acuity and fields appear normal. Hearing is normal. Palatal movements are normal. Face symmetric. Tongue midline. Normal strength, tone, reflexes and coordination. Normal sensation. Gait deferred.         MRI brain 08/24/20:  IMPRESSION: No acute infarction, hemorrhage, or mass.   Stable advanced chronic microvascular ischemic changes.   Similar appearance  of probable sinonasal polyposis.   * MRI scan images were reviewed online. I agree with the written report.    Assessment/Plan: 87 year old lady with remote history of transient vision disturbance episode possibly complicated migraine.  Episode of left face and body numbness January 2022 likely right brain subcortical TIA from small vessel disease.  She is doing well and stable from neurovascular standpoint.  I had a long discussion with the patient and her son regarding her episode of TIA and discussed secondary prevention strategies and recommend she stay on aspirin for stroke prevention and maintain aggressive risk factor modification with strict control of hypertension with blood pressure goal below 140/90, lipids with LDL cholesterol goal below 70 mg percent and diabetes with hemoglobin A1c goal below 6.5%.  She was also advised to get up slowly and avoid sudden movements.  We also discussed fall safety precautions.  She will return for follow-up in the future only as needed and routine schedule appointment was made.  Greater than 50% time during this 30-minute  visit was spent on counseling and coordination of care about her remote TIA and complicated migraine episodes and answering questions. Antony Contras, MD  [facility] [address] [place], [place] [address]  Phone [phone] Fax [phone]

## 2021-08-26 DIAGNOSIS — D509 Iron deficiency anemia, unspecified: Secondary | ICD-10-CM | POA: Diagnosis not present

## 2021-08-27 DIAGNOSIS — H3562 Retinal hemorrhage, left eye: Secondary | ICD-10-CM | POA: Diagnosis not present

## 2021-08-27 DIAGNOSIS — H35373 Puckering of macula, bilateral: Secondary | ICD-10-CM | POA: Diagnosis not present

## 2021-09-10 DIAGNOSIS — M1990 Unspecified osteoarthritis, unspecified site: Secondary | ICD-10-CM | POA: Diagnosis not present

## 2021-09-10 DIAGNOSIS — G4700 Insomnia, unspecified: Secondary | ICD-10-CM | POA: Diagnosis not present

## 2021-09-10 DIAGNOSIS — E7800 Pure hypercholesterolemia, unspecified: Secondary | ICD-10-CM | POA: Diagnosis not present

## 2021-09-10 DIAGNOSIS — M810 Age-related osteoporosis without current pathological fracture: Secondary | ICD-10-CM | POA: Diagnosis not present

## 2021-10-28 DIAGNOSIS — E7800 Pure hypercholesterolemia, unspecified: Secondary | ICD-10-CM | POA: Diagnosis not present

## 2021-10-28 DIAGNOSIS — M810 Age-related osteoporosis without current pathological fracture: Secondary | ICD-10-CM | POA: Diagnosis not present

## 2021-10-28 DIAGNOSIS — G4700 Insomnia, unspecified: Secondary | ICD-10-CM | POA: Diagnosis not present

## 2021-12-10 DIAGNOSIS — D509 Iron deficiency anemia, unspecified: Secondary | ICD-10-CM | POA: Diagnosis not present

## 2021-12-10 DIAGNOSIS — Z86010 Personal history of colonic polyps: Secondary | ICD-10-CM | POA: Diagnosis not present

## 2021-12-10 DIAGNOSIS — K5900 Constipation, unspecified: Secondary | ICD-10-CM | POA: Diagnosis not present

## 2021-12-10 DIAGNOSIS — Z79899 Other long term (current) drug therapy: Secondary | ICD-10-CM | POA: Diagnosis not present

## 2021-12-16 ENCOUNTER — Other Ambulatory Visit: Payer: Self-pay | Admitting: Physician Assistant

## 2021-12-16 DIAGNOSIS — D509 Iron deficiency anemia, unspecified: Secondary | ICD-10-CM

## 2021-12-16 DIAGNOSIS — R634 Abnormal weight loss: Secondary | ICD-10-CM

## 2022-01-02 DIAGNOSIS — Z8673 Personal history of transient ischemic attack (TIA), and cerebral infarction without residual deficits: Secondary | ICD-10-CM | POA: Diagnosis not present

## 2022-01-02 DIAGNOSIS — D509 Iron deficiency anemia, unspecified: Secondary | ICD-10-CM | POA: Diagnosis not present

## 2022-01-02 DIAGNOSIS — M810 Age-related osteoporosis without current pathological fracture: Secondary | ICD-10-CM | POA: Diagnosis not present

## 2022-01-02 DIAGNOSIS — E7800 Pure hypercholesterolemia, unspecified: Secondary | ICD-10-CM | POA: Diagnosis not present

## 2022-01-02 DIAGNOSIS — G4700 Insomnia, unspecified: Secondary | ICD-10-CM | POA: Diagnosis not present

## 2022-01-02 DIAGNOSIS — I700 Atherosclerosis of aorta: Secondary | ICD-10-CM | POA: Diagnosis not present

## 2022-01-09 ENCOUNTER — Other Ambulatory Visit: Payer: Self-pay | Admitting: Diagnostic Neuroimaging

## 2022-01-09 ENCOUNTER — Other Ambulatory Visit: Payer: Self-pay

## 2022-01-09 ENCOUNTER — Other Ambulatory Visit: Payer: Medicare HMO

## 2022-01-09 MED ORDER — METOPROLOL SUCCINATE ER 25 MG PO TB24: 25.0000 mg | ORAL_TABLET | Freq: Every day | ORAL | 0 refills | Status: DC

## 2022-01-14 ENCOUNTER — Ambulatory Visit
Admission: RE | Admit: 2022-01-14 | Discharge: 2022-01-14 | Disposition: A | Discharge status: Home / Self Care | Payer: Medicare HMO | Source: Ambulatory Visit | Attending: Physician Assistant | Admitting: Physician Assistant

## 2022-01-14 DIAGNOSIS — K5730 Diverticulosis of large intestine without perforation or abscess without bleeding: Secondary | ICD-10-CM | POA: Diagnosis not present

## 2022-01-14 DIAGNOSIS — R634 Abnormal weight loss: Secondary | ICD-10-CM | POA: Diagnosis not present

## 2022-01-14 DIAGNOSIS — D509 Iron deficiency anemia, unspecified: Secondary | ICD-10-CM

## 2022-01-14 DIAGNOSIS — K6389 Other specified diseases of intestine: Secondary | ICD-10-CM | POA: Diagnosis not present

## 2022-01-14 MED ORDER — IOPAMIDOL (ISOVUE-300) INJECTION 61%
80.0000 mL | Freq: Once | INTRAVENOUS | Status: AC | PRN
  Administered 2022-01-14: 80 mL via INTRAVENOUS

## 2022-01-19 ENCOUNTER — Other Ambulatory Visit: Payer: Self-pay | Admitting: Diagnostic Neuroimaging

## 2022-01-21 DIAGNOSIS — H3562 Retinal hemorrhage, left eye: Secondary | ICD-10-CM | POA: Diagnosis not present

## 2022-01-21 DIAGNOSIS — H35373 Puckering of macula, bilateral: Secondary | ICD-10-CM | POA: Diagnosis not present

## 2022-02-06 DIAGNOSIS — M810 Age-related osteoporosis without current pathological fracture: Secondary | ICD-10-CM | POA: Diagnosis not present

## 2022-02-06 DIAGNOSIS — M8588 Other specified disorders of bone density and structure, other site: Secondary | ICD-10-CM | POA: Diagnosis not present

## 2022-02-12 DIAGNOSIS — M810 Age-related osteoporosis without current pathological fracture: Secondary | ICD-10-CM | POA: Diagnosis not present

## 2022-02-12 DIAGNOSIS — Z8719 Personal history of other diseases of the digestive system: Secondary | ICD-10-CM | POA: Diagnosis not present

## 2022-03-10 DIAGNOSIS — G4700 Insomnia, unspecified: Secondary | ICD-10-CM | POA: Diagnosis not present

## 2022-03-10 DIAGNOSIS — D509 Iron deficiency anemia, unspecified: Secondary | ICD-10-CM | POA: Diagnosis not present

## 2022-03-10 DIAGNOSIS — E7800 Pure hypercholesterolemia, unspecified: Secondary | ICD-10-CM | POA: Diagnosis not present

## 2022-03-10 DIAGNOSIS — I10 Essential (primary) hypertension: Secondary | ICD-10-CM | POA: Diagnosis not present

## 2022-03-10 DIAGNOSIS — M810 Age-related osteoporosis without current pathological fracture: Secondary | ICD-10-CM | POA: Diagnosis not present

## 2022-03-17 ENCOUNTER — Other Ambulatory Visit: Payer: Self-pay | Admitting: Physician Assistant

## 2022-03-17 DIAGNOSIS — D509 Iron deficiency anemia, unspecified: Secondary | ICD-10-CM

## 2022-03-17 DIAGNOSIS — R9389 Abnormal findings on diagnostic imaging of other specified body structures: Secondary | ICD-10-CM

## 2022-03-17 DIAGNOSIS — Z86010 Personal history of colonic polyps: Secondary | ICD-10-CM

## 2022-03-17 DIAGNOSIS — D132 Benign neoplasm of duodenum: Secondary | ICD-10-CM

## 2022-03-20 ENCOUNTER — Ambulatory Visit
Admission: RE | Admit: 2022-03-20 | Discharge: 2022-03-20 | Disposition: A | Discharge status: Home / Self Care | Payer: Medicare HMO | Source: Ambulatory Visit | Attending: Physician Assistant | Admitting: Physician Assistant

## 2022-03-20 DIAGNOSIS — D509 Iron deficiency anemia, unspecified: Secondary | ICD-10-CM

## 2022-03-20 DIAGNOSIS — D132 Benign neoplasm of duodenum: Secondary | ICD-10-CM

## 2022-03-20 DIAGNOSIS — R9389 Abnormal findings on diagnostic imaging of other specified body structures: Secondary | ICD-10-CM

## 2022-03-20 DIAGNOSIS — K224 Dyskinesia of esophagus: Secondary | ICD-10-CM | POA: Diagnosis not present

## 2022-03-20 DIAGNOSIS — Z86010 Personal history of colonic polyps: Secondary | ICD-10-CM

## 2022-03-30 DIAGNOSIS — D649 Anemia, unspecified: Secondary | ICD-10-CM | POA: Diagnosis not present

## 2022-03-30 DIAGNOSIS — D509 Iron deficiency anemia, unspecified: Secondary | ICD-10-CM | POA: Diagnosis not present

## 2022-05-14 DIAGNOSIS — D372 Neoplasm of uncertain behavior of small intestine: Secondary | ICD-10-CM | POA: Diagnosis not present

## 2022-05-14 DIAGNOSIS — K219 Gastro-esophageal reflux disease without esophagitis: Secondary | ICD-10-CM | POA: Diagnosis not present

## 2022-05-14 DIAGNOSIS — R9389 Abnormal findings on diagnostic imaging of other specified body structures: Secondary | ICD-10-CM | POA: Diagnosis not present

## 2022-05-14 DIAGNOSIS — Z86010 Personal history of colonic polyps: Secondary | ICD-10-CM | POA: Diagnosis not present

## 2022-06-01 ENCOUNTER — Ambulatory Visit: Payer: Medicare HMO | Admitting: Neurology

## 2022-06-10 IMAGING — CT CT ABD-PELV W/ CM
1 of 3 series · 12 of 32 positions shown, 17 images · IV contrast (APPLIED)
Comparison: CT 09/22/2019

CLINICAL DATA: [AGE] with iron deficiency anemia. Weight
loss.

EXAM:
CT ABDOMEN AND PELVIS WITH CONTRAST
TECHNIQUE: Multidetector CT imaging of the abdomen and pelvis was performed
using the standard protocol following bolus administration of
intravenous contrast.

[Series 2: abd/pelvis w/cm · axial · 0.66mm/px · z∈[-425,-65]mm · 12 of 84 slices shown, 17 images]
[im 6/84  soft-tissue]
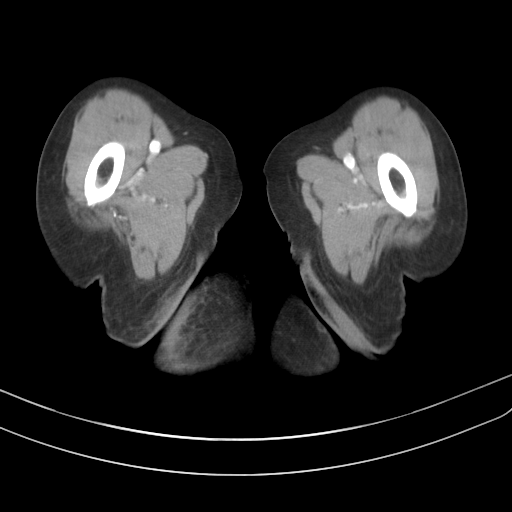
[im 6/84  bone]
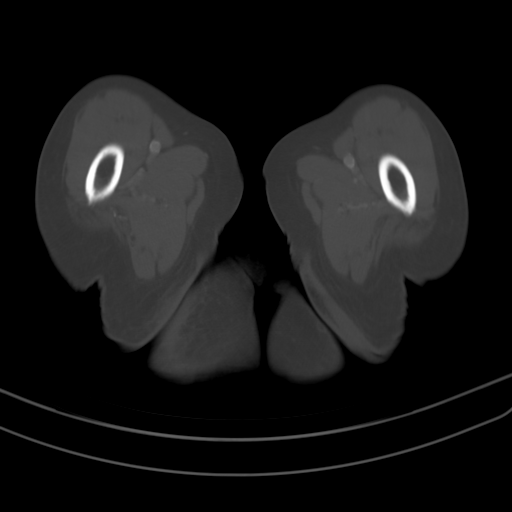
[im 16/84  soft-tissue]
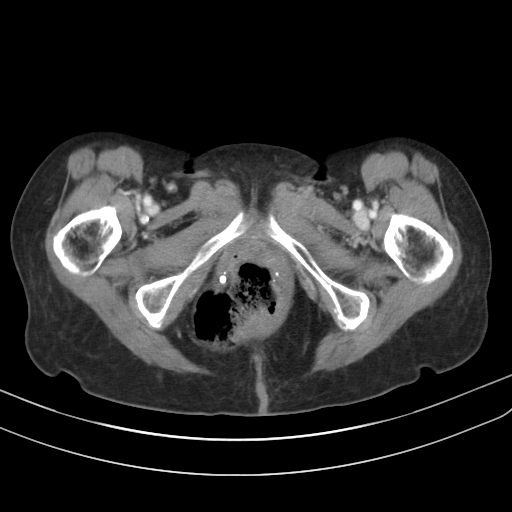
[im 21/84  soft-tissue]
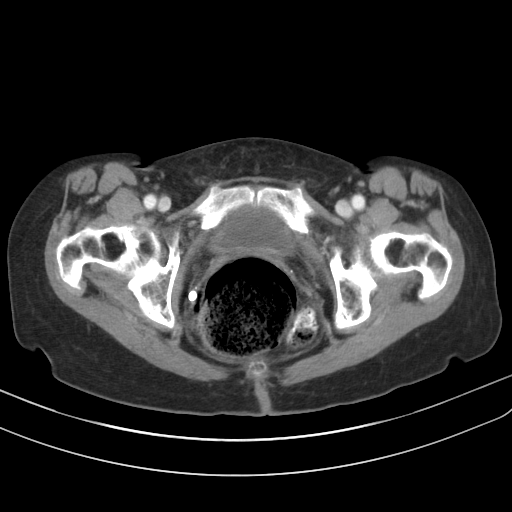
[im 26/84  soft-tissue]
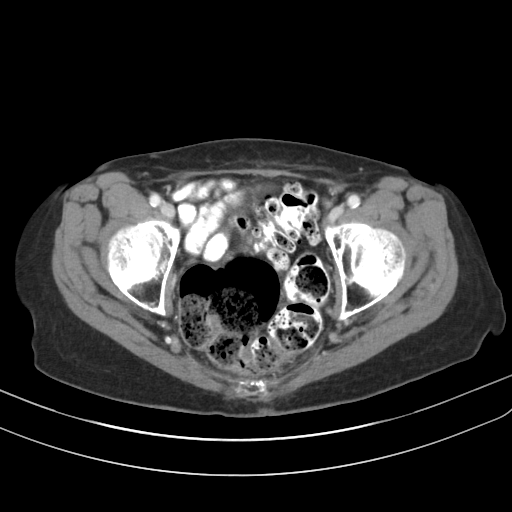
[im 37/84  soft-tissue]
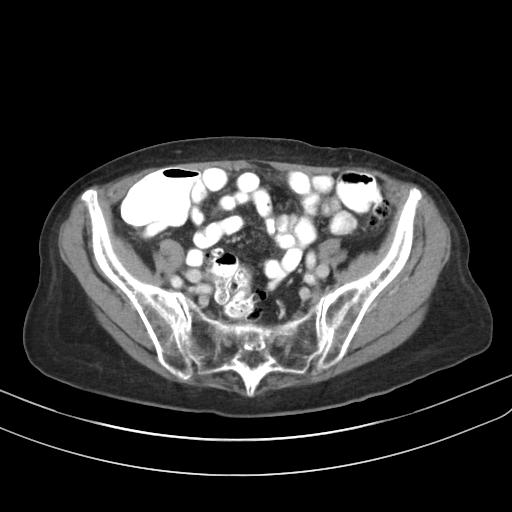
[im 42/84  soft-tissue]
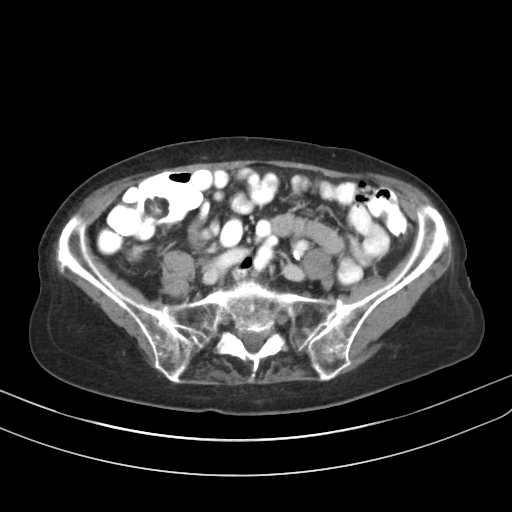
[im 47/84  soft-tissue]
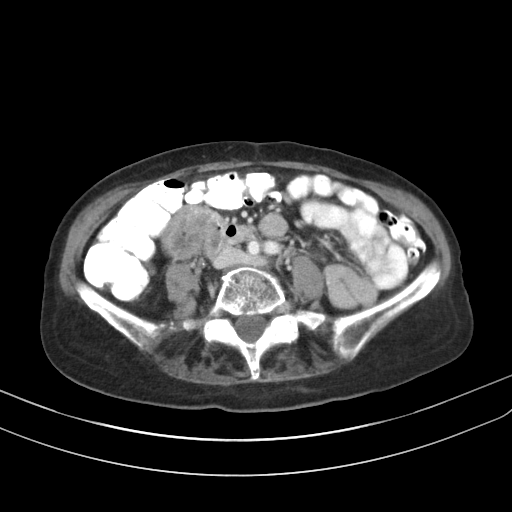
[im 58/84  soft-tissue]
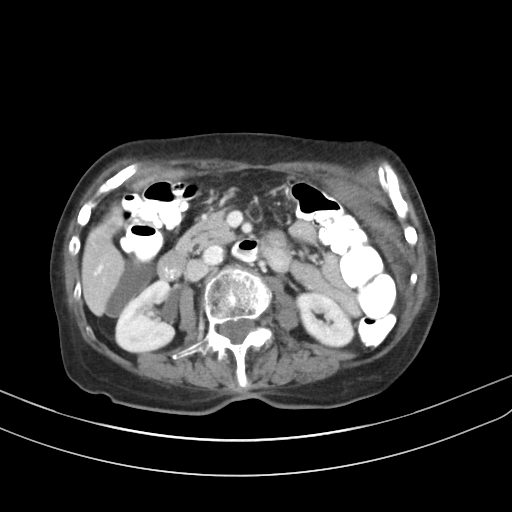
[im 63/84  soft-tissue]
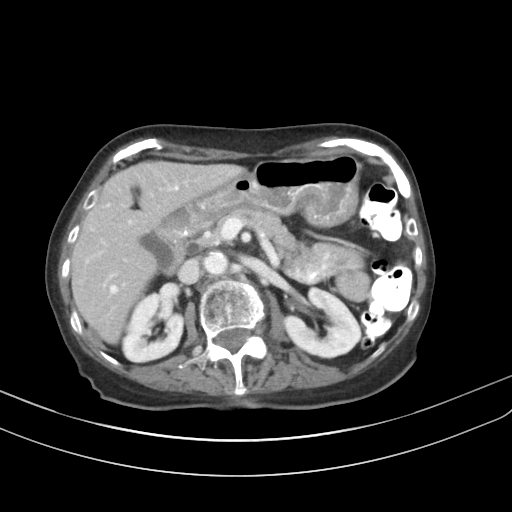
[im 63/84  lung]
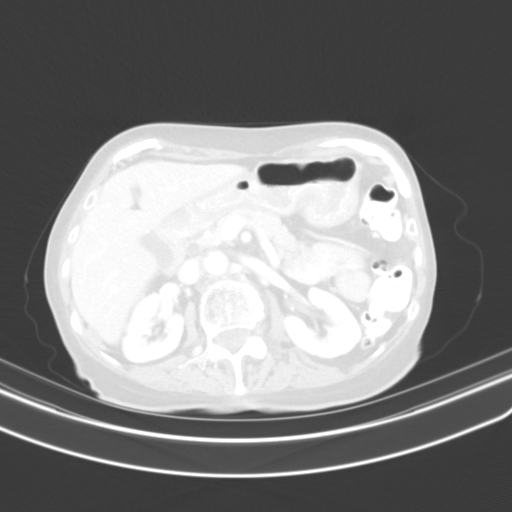
[im 63/84  bone]
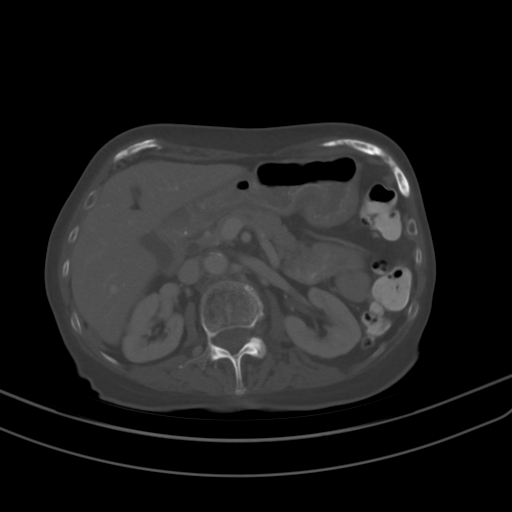
[im 68/84  soft-tissue]
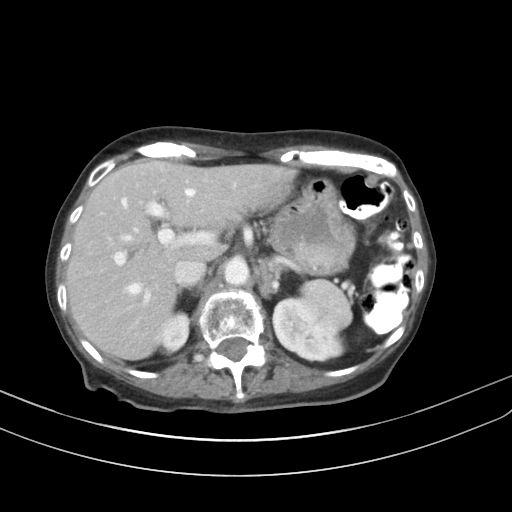
[im 68/84  lung]
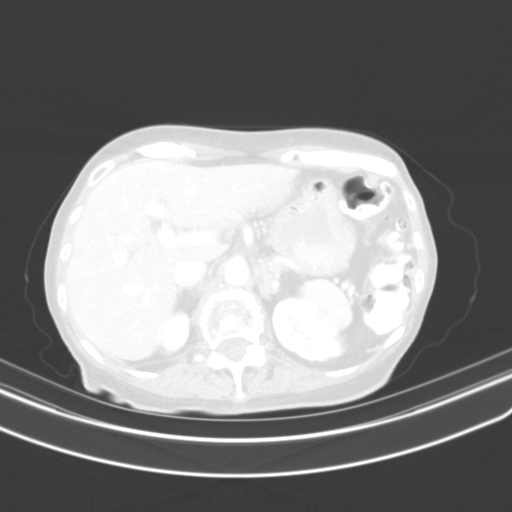
[im 73/84  lung]
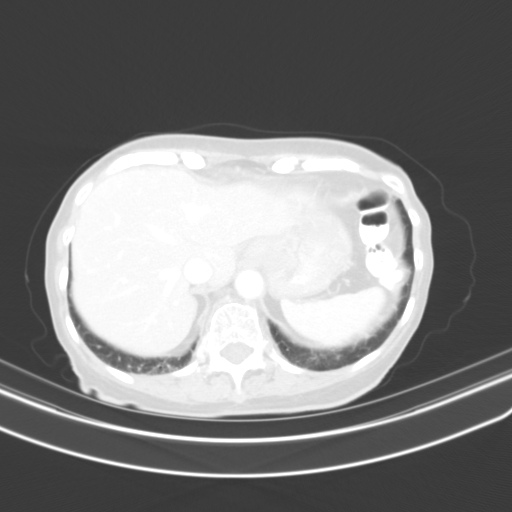
[im 78/84  soft-tissue]
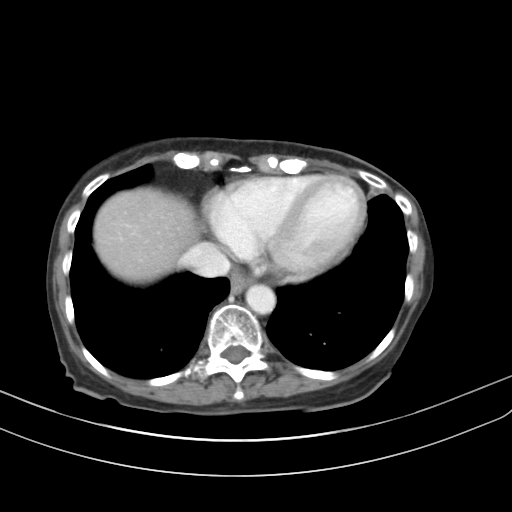
[im 78/84  lung]
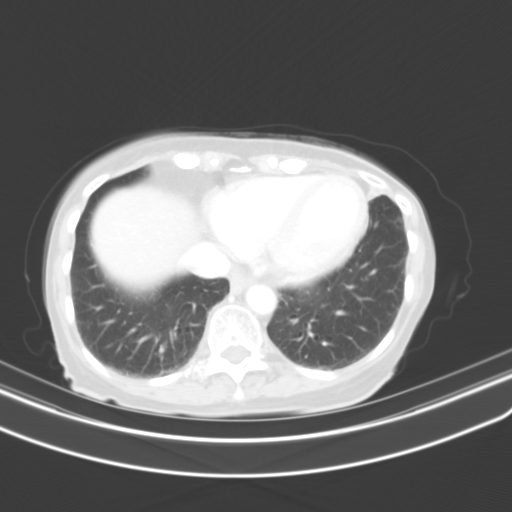

[12 of 32 positions shown; findings below may reference images not displayed]

RADIATION DOSE REDUCTION: This exam was performed according to the
departmental dose-optimization program which includes automated
exposure control, adjustment of the mA and/or kV according to
patient size and/or use of iterative reconstruction technique.

CONTRAST:  80mL [id number]-EQQ IOPAMIDOL (BKXKB7-EQQ) INJECTION 61%
FINDINGS: Lower chest: Mild lingular bronchiectasis with mucoid impaction.
Mild bilateral lower lobe bronchiectasis. No pleural effusion.

Hepatobiliary: Mild hepatic steatosis. Tiny hypodensity in the
posterior right hepatic lobe is too small to characterize but likely
small cyst. This is unchanged from prior exam. Gallbladder
physiologically distended, no calcified stone. No biliary
dilatation.

Pancreas: Unremarkable. No pancreatic ductal dilatation or
surrounding inflammatory changes. No evidence of pancreatic mass.

Spleen: Normal in size. Benign punctate granuloma. No focal lesion.

Adrenals/Urinary Tract: No adrenal nodule, slight left adrenal
thickening is stable. No hydronephrosis. Homogeneous renal
enhancement with symmetric excretion on delayed phase imaging. There
are small bilateral low-density renal lesions are too small to
accurately characterize, but likely represent cysts. No significant
interval change from 0308 exam. No dedicated imaging follow-up
recommended. No visualized renal calculi. Minimally distended but
unremarkable urinary bladder.

Stomach/Bowel: There is a small hiatal hernia. Contrast mixed with
fluid in the stomach. No gastric wall thickening. There is lobulated
low-density wall thickening about the [DATE] portion of the duodenum,
series 2, images 36-40. No luminal narrowing or proximal
obstruction. Small bowel is otherwise unremarkable. No obstruction
or inflammation. Prior appendectomy per history. Multifocal colonic
diverticulosis. No diverticulitis. No acute colonic inflammation.
Moderate colonic stool burden. There is stool distending the rectum.
No evidence of colonic mass.

Vascular/Lymphatic: Aortic atherosclerosis and tortuosity. No aortic
aneurysm. The portal vein is patent. Suspected 8 mm retroperitoneal
node adjacent to the duodenal wall thickening, series 2, image 33.

Reproductive: Hysterectomy.  No adnexal mass.

Other: No free fluid or intra-abdominal fluid collection. No
abdominopelvic fluid collection.

Musculoskeletal: Mild to moderate L1 and L3 compression fractures
are new from 0308 exam. Prominent Schmorl's node and superior
endplate deformity of L2 is chronic and unchanged. Lower lumbar
degenerative change. The bones are subjectively under mineralized.
No focal bone lesion.
IMPRESSION: 1. Lobulated low-density wall thickening about the [DATE] portion of
the duodenum. This may represent sequela of peptic ulcer disease,
however neoplasm is also considered. Recommend correlation with
endoscopy.
2. Suspected 8 mm retroperitoneal node adjacent to the duodenal wall
thickening, nonspecific.
3. Mild to moderate L1 and L3 compression fractures are new from
0308 exam.
4. Colonic diverticulosis without diverticulitis. Moderate colonic
stool burden with stool distending the rectum, can be seen with
constipation.
5. Mild hepatic steatosis.
6. Lower lobe bronchiectasis. Lingular bronchiectasis with mucoid
impaction.

Aortic Atherosclerosis ([id number]).

These results will be called to the ordering clinician or
representative by the Radiologist Assistant, and communication
documented in the PACS or [REDACTED].

## 2022-06-11 DIAGNOSIS — M1990 Unspecified osteoarthritis, unspecified site: Secondary | ICD-10-CM | POA: Diagnosis not present

## 2022-06-11 DIAGNOSIS — E7800 Pure hypercholesterolemia, unspecified: Secondary | ICD-10-CM | POA: Diagnosis not present

## 2022-06-11 DIAGNOSIS — I700 Atherosclerosis of aorta: Secondary | ICD-10-CM | POA: Diagnosis not present

## 2022-06-11 DIAGNOSIS — Z79899 Other long term (current) drug therapy: Secondary | ICD-10-CM | POA: Diagnosis not present

## 2022-06-11 DIAGNOSIS — Z8673 Personal history of transient ischemic attack (TIA), and cerebral infarction without residual deficits: Secondary | ICD-10-CM | POA: Diagnosis not present

## 2022-06-11 DIAGNOSIS — I10 Essential (primary) hypertension: Secondary | ICD-10-CM | POA: Diagnosis not present

## 2022-06-11 DIAGNOSIS — Z0000 Encounter for general adult medical examination without abnormal findings: Secondary | ICD-10-CM | POA: Diagnosis not present

## 2022-06-11 DIAGNOSIS — Z23 Encounter for immunization: Secondary | ICD-10-CM | POA: Diagnosis not present

## 2022-06-11 DIAGNOSIS — K279 Peptic ulcer, site unspecified, unspecified as acute or chronic, without hemorrhage or perforation: Secondary | ICD-10-CM | POA: Diagnosis not present

## 2022-06-11 DIAGNOSIS — M810 Age-related osteoporosis without current pathological fracture: Secondary | ICD-10-CM | POA: Diagnosis not present

## 2022-06-22 ENCOUNTER — Inpatient Hospital Stay: Admit: 2022-06-22 | Payer: Medicare HMO | Admitting: Internal Medicine

## 2022-06-22 ENCOUNTER — Encounter (HOSPITAL_COMMUNITY): Payer: Self-pay

## 2022-06-22 DIAGNOSIS — D509 Iron deficiency anemia, unspecified: Secondary | ICD-10-CM | POA: Diagnosis not present

## 2022-06-22 DIAGNOSIS — R531 Weakness: Secondary | ICD-10-CM | POA: Diagnosis not present

## 2022-06-22 DIAGNOSIS — N390 Urinary tract infection, site not specified: Secondary | ICD-10-CM | POA: Diagnosis not present

## 2022-06-22 DIAGNOSIS — D500 Iron deficiency anemia secondary to blood loss (chronic): Secondary | ICD-10-CM | POA: Diagnosis not present

## 2022-06-22 DIAGNOSIS — H9190 Unspecified hearing loss, unspecified ear: Secondary | ICD-10-CM | POA: Diagnosis not present

## 2022-06-22 DIAGNOSIS — Z8719 Personal history of other diseases of the digestive system: Secondary | ICD-10-CM | POA: Diagnosis not present

## 2022-06-22 DIAGNOSIS — Z789 Other specified health status: Secondary | ICD-10-CM | POA: Diagnosis not present

## 2022-06-22 DIAGNOSIS — J479 Bronchiectasis, uncomplicated: Secondary | ICD-10-CM | POA: Diagnosis not present

## 2022-06-22 DIAGNOSIS — Z7189 Other specified counseling: Secondary | ICD-10-CM | POA: Diagnosis not present

## 2022-06-22 DIAGNOSIS — Z66 Do not resuscitate: Secondary | ICD-10-CM | POA: Diagnosis not present

## 2022-06-22 DIAGNOSIS — R1900 Intra-abdominal and pelvic swelling, mass and lump, unspecified site: Secondary | ICD-10-CM | POA: Diagnosis not present

## 2022-06-22 DIAGNOSIS — J9811 Atelectasis: Secondary | ICD-10-CM | POA: Diagnosis not present

## 2022-06-22 DIAGNOSIS — K921 Melena: Secondary | ICD-10-CM | POA: Diagnosis not present

## 2022-06-22 DIAGNOSIS — G9340 Encephalopathy, unspecified: Secondary | ICD-10-CM | POA: Diagnosis not present

## 2022-06-22 DIAGNOSIS — I708 Atherosclerosis of other arteries: Secondary | ICD-10-CM | POA: Diagnosis not present

## 2022-06-22 DIAGNOSIS — R1010 Upper abdominal pain, unspecified: Secondary | ICD-10-CM | POA: Diagnosis not present

## 2022-06-22 DIAGNOSIS — Z681 Body mass index (BMI) 19 or less, adult: Secondary | ICD-10-CM | POA: Diagnosis not present

## 2022-06-22 DIAGNOSIS — F418 Other specified anxiety disorders: Secondary | ICD-10-CM | POA: Diagnosis not present

## 2022-06-22 DIAGNOSIS — I2510 Atherosclerotic heart disease of native coronary artery without angina pectoris: Secondary | ICD-10-CM | POA: Diagnosis not present

## 2022-06-22 DIAGNOSIS — D62 Acute posthemorrhagic anemia: Secondary | ICD-10-CM | POA: Diagnosis not present

## 2022-06-22 DIAGNOSIS — G4700 Insomnia, unspecified: Secondary | ICD-10-CM | POA: Diagnosis not present

## 2022-06-22 DIAGNOSIS — I10 Essential (primary) hypertension: Secondary | ICD-10-CM | POA: Diagnosis not present

## 2022-06-22 DIAGNOSIS — I959 Hypotension, unspecified: Secondary | ICD-10-CM | POA: Diagnosis not present

## 2022-06-22 DIAGNOSIS — C170 Malignant neoplasm of duodenum: Secondary | ICD-10-CM | POA: Diagnosis not present

## 2022-06-22 DIAGNOSIS — R4182 Altered mental status, unspecified: Secondary | ICD-10-CM | POA: Diagnosis not present

## 2022-06-22 DIAGNOSIS — K3189 Other diseases of stomach and duodenum: Secondary | ICD-10-CM | POA: Diagnosis not present

## 2022-06-22 DIAGNOSIS — R933 Abnormal findings on diagnostic imaging of other parts of digestive tract: Secondary | ICD-10-CM | POA: Diagnosis not present

## 2022-06-22 DIAGNOSIS — R110 Nausea: Secondary | ICD-10-CM | POA: Diagnosis not present

## 2022-06-22 DIAGNOSIS — E785 Hyperlipidemia, unspecified: Secondary | ICD-10-CM | POA: Diagnosis not present

## 2022-06-22 DIAGNOSIS — R410 Disorientation, unspecified: Secondary | ICD-10-CM | POA: Diagnosis not present

## 2022-06-22 DIAGNOSIS — N3000 Acute cystitis without hematuria: Secondary | ICD-10-CM | POA: Diagnosis not present

## 2022-06-22 DIAGNOSIS — I6782 Cerebral ischemia: Secondary | ICD-10-CM | POA: Diagnosis not present

## 2022-06-22 DIAGNOSIS — E871 Hypo-osmolality and hyponatremia: Secondary | ICD-10-CM | POA: Diagnosis not present

## 2022-06-22 DIAGNOSIS — E46 Unspecified protein-calorie malnutrition: Secondary | ICD-10-CM | POA: Diagnosis not present

## 2022-06-22 DIAGNOSIS — K922 Gastrointestinal hemorrhage, unspecified: Secondary | ICD-10-CM | POA: Diagnosis not present

## 2022-06-22 DIAGNOSIS — Z862 Personal history of diseases of the blood and blood-forming organs and certain disorders involving the immune mechanism: Secondary | ICD-10-CM | POA: Diagnosis not present

## 2022-06-22 DIAGNOSIS — Z515 Encounter for palliative care: Secondary | ICD-10-CM | POA: Diagnosis not present

## 2022-06-22 DIAGNOSIS — R1084 Generalized abdominal pain: Secondary | ICD-10-CM | POA: Diagnosis not present

## 2022-06-22 DIAGNOSIS — E222 Syndrome of inappropriate secretion of antidiuretic hormone: Secondary | ICD-10-CM | POA: Diagnosis not present

## 2022-06-22 DIAGNOSIS — D649 Anemia, unspecified: Secondary | ICD-10-CM | POA: Diagnosis not present

## 2022-06-24 DIAGNOSIS — D500 Iron deficiency anemia secondary to blood loss (chronic): Secondary | ICD-10-CM | POA: Diagnosis not present

## 2022-06-24 DIAGNOSIS — K3189 Other diseases of stomach and duodenum: Secondary | ICD-10-CM | POA: Diagnosis not present

## 2022-06-24 DIAGNOSIS — R531 Weakness: Secondary | ICD-10-CM | POA: Diagnosis not present

## 2022-06-24 DIAGNOSIS — K922 Gastrointestinal hemorrhage, unspecified: Secondary | ICD-10-CM | POA: Diagnosis not present

## 2022-06-25 DIAGNOSIS — D500 Iron deficiency anemia secondary to blood loss (chronic): Secondary | ICD-10-CM | POA: Diagnosis not present

## 2022-06-25 DIAGNOSIS — K3189 Other diseases of stomach and duodenum: Secondary | ICD-10-CM | POA: Diagnosis not present

## 2022-06-25 DIAGNOSIS — K922 Gastrointestinal hemorrhage, unspecified: Secondary | ICD-10-CM | POA: Diagnosis not present

## 2022-06-25 DIAGNOSIS — D62 Acute posthemorrhagic anemia: Secondary | ICD-10-CM | POA: Diagnosis not present

## 2022-06-26 DIAGNOSIS — K922 Gastrointestinal hemorrhage, unspecified: Secondary | ICD-10-CM | POA: Diagnosis not present

## 2022-06-26 DIAGNOSIS — C170 Malignant neoplasm of duodenum: Secondary | ICD-10-CM | POA: Diagnosis not present

## 2022-06-26 DIAGNOSIS — K3189 Other diseases of stomach and duodenum: Secondary | ICD-10-CM | POA: Diagnosis not present

## 2022-06-26 DIAGNOSIS — I10 Essential (primary) hypertension: Secondary | ICD-10-CM | POA: Diagnosis not present

## 2022-06-26 DIAGNOSIS — R1900 Intra-abdominal and pelvic swelling, mass and lump, unspecified site: Secondary | ICD-10-CM | POA: Diagnosis not present

## 2022-06-26 DIAGNOSIS — R933 Abnormal findings on diagnostic imaging of other parts of digestive tract: Secondary | ICD-10-CM | POA: Diagnosis not present

## 2022-06-26 DIAGNOSIS — D500 Iron deficiency anemia secondary to blood loss (chronic): Secondary | ICD-10-CM | POA: Diagnosis not present

## 2022-06-26 DIAGNOSIS — I2510 Atherosclerotic heart disease of native coronary artery without angina pectoris: Secondary | ICD-10-CM | POA: Diagnosis not present

## 2022-06-27 DIAGNOSIS — D62 Acute posthemorrhagic anemia: Secondary | ICD-10-CM | POA: Diagnosis not present

## 2022-06-27 DIAGNOSIS — K3189 Other diseases of stomach and duodenum: Secondary | ICD-10-CM | POA: Diagnosis not present

## 2022-06-27 DIAGNOSIS — K922 Gastrointestinal hemorrhage, unspecified: Secondary | ICD-10-CM | POA: Diagnosis not present

## 2022-06-27 DIAGNOSIS — D500 Iron deficiency anemia secondary to blood loss (chronic): Secondary | ICD-10-CM | POA: Diagnosis not present

## 2022-06-28 DIAGNOSIS — K922 Gastrointestinal hemorrhage, unspecified: Secondary | ICD-10-CM | POA: Diagnosis not present

## 2022-06-28 DIAGNOSIS — D500 Iron deficiency anemia secondary to blood loss (chronic): Secondary | ICD-10-CM | POA: Diagnosis not present

## 2022-06-28 DIAGNOSIS — K3189 Other diseases of stomach and duodenum: Secondary | ICD-10-CM | POA: Diagnosis not present

## 2022-06-29 DIAGNOSIS — K3189 Other diseases of stomach and duodenum: Secondary | ICD-10-CM | POA: Diagnosis not present

## 2022-06-29 DIAGNOSIS — D500 Iron deficiency anemia secondary to blood loss (chronic): Secondary | ICD-10-CM | POA: Diagnosis not present

## 2022-06-29 DIAGNOSIS — K922 Gastrointestinal hemorrhage, unspecified: Secondary | ICD-10-CM | POA: Diagnosis not present

## 2022-06-29 DIAGNOSIS — C170 Malignant neoplasm of duodenum: Secondary | ICD-10-CM | POA: Diagnosis not present

## 2022-06-30 DIAGNOSIS — K922 Gastrointestinal hemorrhage, unspecified: Secondary | ICD-10-CM | POA: Diagnosis not present

## 2022-06-30 DIAGNOSIS — I2510 Atherosclerotic heart disease of native coronary artery without angina pectoris: Secondary | ICD-10-CM | POA: Diagnosis not present

## 2022-06-30 DIAGNOSIS — C170 Malignant neoplasm of duodenum: Secondary | ICD-10-CM | POA: Diagnosis not present

## 2022-06-30 DIAGNOSIS — I10 Essential (primary) hypertension: Secondary | ICD-10-CM | POA: Diagnosis not present

## 2022-06-30 DIAGNOSIS — R410 Disorientation, unspecified: Secondary | ICD-10-CM | POA: Diagnosis not present

## 2022-06-30 DIAGNOSIS — Z515 Encounter for palliative care: Secondary | ICD-10-CM | POA: Diagnosis not present

## 2022-06-30 DIAGNOSIS — D500 Iron deficiency anemia secondary to blood loss (chronic): Secondary | ICD-10-CM | POA: Diagnosis not present

## 2022-06-30 DIAGNOSIS — R110 Nausea: Secondary | ICD-10-CM | POA: Diagnosis not present

## 2022-06-30 DIAGNOSIS — Z7189 Other specified counseling: Secondary | ICD-10-CM | POA: Diagnosis not present

## 2022-06-30 DIAGNOSIS — G4700 Insomnia, unspecified: Secondary | ICD-10-CM | POA: Diagnosis not present

## 2022-06-30 DIAGNOSIS — K3189 Other diseases of stomach and duodenum: Secondary | ICD-10-CM | POA: Diagnosis not present

## 2022-06-30 DIAGNOSIS — R1010 Upper abdominal pain, unspecified: Secondary | ICD-10-CM | POA: Diagnosis not present

## 2022-06-30 DIAGNOSIS — Z862 Personal history of diseases of the blood and blood-forming organs and certain disorders involving the immune mechanism: Secondary | ICD-10-CM | POA: Diagnosis not present

## 2022-06-30 DIAGNOSIS — E785 Hyperlipidemia, unspecified: Secondary | ICD-10-CM | POA: Diagnosis not present

## 2022-06-30 DIAGNOSIS — N3000 Acute cystitis without hematuria: Secondary | ICD-10-CM | POA: Diagnosis not present

## 2022-07-01 DIAGNOSIS — E871 Hypo-osmolality and hyponatremia: Secondary | ICD-10-CM | POA: Diagnosis not present

## 2022-07-01 DIAGNOSIS — K921 Melena: Secondary | ICD-10-CM | POA: Diagnosis not present

## 2022-07-01 DIAGNOSIS — C170 Malignant neoplasm of duodenum: Secondary | ICD-10-CM | POA: Diagnosis not present

## 2022-07-01 DIAGNOSIS — I10 Essential (primary) hypertension: Secondary | ICD-10-CM | POA: Diagnosis not present

## 2022-07-01 DIAGNOSIS — N3000 Acute cystitis without hematuria: Secondary | ICD-10-CM | POA: Diagnosis not present

## 2022-07-01 DIAGNOSIS — K3189 Other diseases of stomach and duodenum: Secondary | ICD-10-CM | POA: Diagnosis not present

## 2022-07-01 DIAGNOSIS — I2510 Atherosclerotic heart disease of native coronary artery without angina pectoris: Secondary | ICD-10-CM | POA: Diagnosis not present

## 2022-07-01 DIAGNOSIS — D62 Acute posthemorrhagic anemia: Secondary | ICD-10-CM | POA: Diagnosis not present

## 2022-07-01 DIAGNOSIS — G9340 Encephalopathy, unspecified: Secondary | ICD-10-CM | POA: Diagnosis not present

## 2022-07-02 DIAGNOSIS — D62 Acute posthemorrhagic anemia: Secondary | ICD-10-CM | POA: Diagnosis not present

## 2022-07-02 DIAGNOSIS — J9811 Atelectasis: Secondary | ICD-10-CM | POA: Diagnosis not present

## 2022-07-02 DIAGNOSIS — N3000 Acute cystitis without hematuria: Secondary | ICD-10-CM | POA: Diagnosis not present

## 2022-07-02 DIAGNOSIS — R4182 Altered mental status, unspecified: Secondary | ICD-10-CM | POA: Diagnosis not present

## 2022-07-02 DIAGNOSIS — G9340 Encephalopathy, unspecified: Secondary | ICD-10-CM | POA: Diagnosis not present

## 2022-07-02 DIAGNOSIS — I10 Essential (primary) hypertension: Secondary | ICD-10-CM | POA: Diagnosis not present

## 2022-07-02 DIAGNOSIS — C170 Malignant neoplasm of duodenum: Secondary | ICD-10-CM | POA: Diagnosis not present

## 2022-07-02 DIAGNOSIS — I2510 Atherosclerotic heart disease of native coronary artery without angina pectoris: Secondary | ICD-10-CM | POA: Diagnosis not present

## 2022-07-02 DIAGNOSIS — I6782 Cerebral ischemia: Secondary | ICD-10-CM | POA: Diagnosis not present

## 2022-07-02 DIAGNOSIS — J479 Bronchiectasis, uncomplicated: Secondary | ICD-10-CM | POA: Diagnosis not present

## 2022-07-02 DIAGNOSIS — E871 Hypo-osmolality and hyponatremia: Secondary | ICD-10-CM | POA: Diagnosis not present

## 2022-07-02 DIAGNOSIS — K921 Melena: Secondary | ICD-10-CM | POA: Diagnosis not present

## 2022-07-03 DIAGNOSIS — C170 Malignant neoplasm of duodenum: Secondary | ICD-10-CM | POA: Diagnosis not present

## 2022-07-03 DIAGNOSIS — G9340 Encephalopathy, unspecified: Secondary | ICD-10-CM | POA: Diagnosis not present

## 2022-07-03 DIAGNOSIS — R1084 Generalized abdominal pain: Secondary | ICD-10-CM | POA: Diagnosis not present

## 2022-07-03 DIAGNOSIS — N3000 Acute cystitis without hematuria: Secondary | ICD-10-CM | POA: Diagnosis not present

## 2022-07-03 DIAGNOSIS — R410 Disorientation, unspecified: Secondary | ICD-10-CM | POA: Diagnosis not present

## 2022-07-03 DIAGNOSIS — E871 Hypo-osmolality and hyponatremia: Secondary | ICD-10-CM | POA: Diagnosis not present

## 2022-07-03 DIAGNOSIS — D62 Acute posthemorrhagic anemia: Secondary | ICD-10-CM | POA: Diagnosis not present

## 2022-07-03 DIAGNOSIS — H9190 Unspecified hearing loss, unspecified ear: Secondary | ICD-10-CM | POA: Diagnosis not present

## 2022-07-03 DIAGNOSIS — I10 Essential (primary) hypertension: Secondary | ICD-10-CM | POA: Diagnosis not present

## 2022-07-03 DIAGNOSIS — Z515 Encounter for palliative care: Secondary | ICD-10-CM | POA: Diagnosis not present

## 2022-07-03 DIAGNOSIS — Z7189 Other specified counseling: Secondary | ICD-10-CM | POA: Diagnosis not present

## 2022-07-03 DIAGNOSIS — F418 Other specified anxiety disorders: Secondary | ICD-10-CM | POA: Diagnosis not present

## 2022-07-03 DIAGNOSIS — K921 Melena: Secondary | ICD-10-CM | POA: Diagnosis not present

## 2022-07-03 DIAGNOSIS — G4700 Insomnia, unspecified: Secondary | ICD-10-CM | POA: Diagnosis not present

## 2022-07-03 DIAGNOSIS — Z789 Other specified health status: Secondary | ICD-10-CM | POA: Diagnosis not present

## 2022-07-04 DIAGNOSIS — G9340 Encephalopathy, unspecified: Secondary | ICD-10-CM | POA: Diagnosis not present

## 2022-07-04 DIAGNOSIS — E871 Hypo-osmolality and hyponatremia: Secondary | ICD-10-CM | POA: Diagnosis not present

## 2022-07-04 DIAGNOSIS — N3000 Acute cystitis without hematuria: Secondary | ICD-10-CM | POA: Diagnosis not present

## 2022-07-04 DIAGNOSIS — C170 Malignant neoplasm of duodenum: Secondary | ICD-10-CM | POA: Diagnosis not present

## 2022-07-04 DIAGNOSIS — I10 Essential (primary) hypertension: Secondary | ICD-10-CM | POA: Diagnosis not present

## 2022-07-04 DIAGNOSIS — D62 Acute posthemorrhagic anemia: Secondary | ICD-10-CM | POA: Diagnosis not present

## 2022-07-04 DIAGNOSIS — K921 Melena: Secondary | ICD-10-CM | POA: Diagnosis not present

## 2022-07-05 DIAGNOSIS — C170 Malignant neoplasm of duodenum: Secondary | ICD-10-CM | POA: Diagnosis not present

## 2022-07-05 DIAGNOSIS — R1084 Generalized abdominal pain: Secondary | ICD-10-CM | POA: Diagnosis not present

## 2022-07-05 DIAGNOSIS — G4700 Insomnia, unspecified: Secondary | ICD-10-CM | POA: Diagnosis not present

## 2022-07-05 DIAGNOSIS — E222 Syndrome of inappropriate secretion of antidiuretic hormone: Secondary | ICD-10-CM | POA: Diagnosis not present

## 2022-07-05 DIAGNOSIS — R410 Disorientation, unspecified: Secondary | ICD-10-CM | POA: Diagnosis not present

## 2022-07-05 DIAGNOSIS — D62 Acute posthemorrhagic anemia: Secondary | ICD-10-CM | POA: Diagnosis not present

## 2022-07-05 DIAGNOSIS — K921 Melena: Secondary | ICD-10-CM | POA: Diagnosis not present

## 2022-07-05 DIAGNOSIS — G9340 Encephalopathy, unspecified: Secondary | ICD-10-CM | POA: Diagnosis not present

## 2022-07-05 DIAGNOSIS — Z7189 Other specified counseling: Secondary | ICD-10-CM | POA: Diagnosis not present

## 2022-07-05 DIAGNOSIS — H9190 Unspecified hearing loss, unspecified ear: Secondary | ICD-10-CM | POA: Diagnosis not present

## 2022-07-05 DIAGNOSIS — Z515 Encounter for palliative care: Secondary | ICD-10-CM | POA: Diagnosis not present

## 2022-07-05 DIAGNOSIS — I10 Essential (primary) hypertension: Secondary | ICD-10-CM | POA: Diagnosis not present

## 2022-07-05 DIAGNOSIS — F418 Other specified anxiety disorders: Secondary | ICD-10-CM | POA: Diagnosis not present

## 2022-07-05 DIAGNOSIS — Z789 Other specified health status: Secondary | ICD-10-CM | POA: Diagnosis not present

## 2022-07-05 DIAGNOSIS — N3000 Acute cystitis without hematuria: Secondary | ICD-10-CM | POA: Diagnosis not present

## 2022-07-06 DIAGNOSIS — C170 Malignant neoplasm of duodenum: Secondary | ICD-10-CM | POA: Diagnosis not present

## 2022-07-06 DIAGNOSIS — N3000 Acute cystitis without hematuria: Secondary | ICD-10-CM | POA: Diagnosis not present

## 2022-07-06 DIAGNOSIS — G9340 Encephalopathy, unspecified: Secondary | ICD-10-CM | POA: Diagnosis not present

## 2022-07-06 DIAGNOSIS — E222 Syndrome of inappropriate secretion of antidiuretic hormone: Secondary | ICD-10-CM | POA: Diagnosis not present

## 2022-07-06 DIAGNOSIS — I10 Essential (primary) hypertension: Secondary | ICD-10-CM | POA: Diagnosis not present

## 2022-07-06 DIAGNOSIS — D62 Acute posthemorrhagic anemia: Secondary | ICD-10-CM | POA: Diagnosis not present

## 2022-07-06 DIAGNOSIS — K921 Melena: Secondary | ICD-10-CM | POA: Diagnosis not present

## 2022-07-07 DIAGNOSIS — M25552 Pain in left hip: Secondary | ICD-10-CM | POA: Diagnosis not present

## 2022-07-07 DIAGNOSIS — D62 Acute posthemorrhagic anemia: Secondary | ICD-10-CM | POA: Diagnosis not present

## 2022-07-07 DIAGNOSIS — R5381 Other malaise: Secondary | ICD-10-CM | POA: Diagnosis not present

## 2022-07-07 DIAGNOSIS — Z8719 Personal history of other diseases of the digestive system: Secondary | ICD-10-CM | POA: Diagnosis not present

## 2022-07-07 DIAGNOSIS — K922 Gastrointestinal hemorrhage, unspecified: Secondary | ICD-10-CM | POA: Diagnosis not present

## 2022-07-07 DIAGNOSIS — C170 Malignant neoplasm of duodenum: Secondary | ICD-10-CM | POA: Diagnosis not present

## 2022-07-07 DIAGNOSIS — Z66 Do not resuscitate: Secondary | ICD-10-CM | POA: Diagnosis not present

## 2022-07-07 DIAGNOSIS — N390 Urinary tract infection, site not specified: Secondary | ICD-10-CM | POA: Diagnosis not present

## 2022-07-07 DIAGNOSIS — N3000 Acute cystitis without hematuria: Secondary | ICD-10-CM | POA: Diagnosis not present

## 2022-07-07 DIAGNOSIS — G9340 Encephalopathy, unspecified: Secondary | ICD-10-CM | POA: Diagnosis not present

## 2022-07-07 DIAGNOSIS — I2510 Atherosclerotic heart disease of native coronary artery without angina pectoris: Secondary | ICD-10-CM | POA: Diagnosis not present

## 2022-07-07 DIAGNOSIS — F0393 Unspecified dementia, unspecified severity, with mood disturbance: Secondary | ICD-10-CM | POA: Diagnosis not present

## 2022-07-07 DIAGNOSIS — E785 Hyperlipidemia, unspecified: Secondary | ICD-10-CM | POA: Diagnosis not present

## 2022-07-07 DIAGNOSIS — B9620 Unspecified Escherichia coli [E. coli] as the cause of diseases classified elsewhere: Secondary | ICD-10-CM | POA: Diagnosis not present

## 2022-07-07 DIAGNOSIS — D649 Anemia, unspecified: Secondary | ICD-10-CM | POA: Diagnosis not present

## 2022-07-07 DIAGNOSIS — K219 Gastro-esophageal reflux disease without esophagitis: Secondary | ICD-10-CM | POA: Diagnosis not present

## 2022-07-07 DIAGNOSIS — E222 Syndrome of inappropriate secretion of antidiuretic hormone: Secondary | ICD-10-CM | POA: Diagnosis not present

## 2022-07-07 DIAGNOSIS — I10 Essential (primary) hypertension: Secondary | ICD-10-CM | POA: Diagnosis not present

## 2022-07-07 DIAGNOSIS — K921 Melena: Secondary | ICD-10-CM | POA: Diagnosis not present

## 2022-07-08 DIAGNOSIS — K922 Gastrointestinal hemorrhage, unspecified: Secondary | ICD-10-CM | POA: Diagnosis not present

## 2022-07-08 DIAGNOSIS — I10 Essential (primary) hypertension: Secondary | ICD-10-CM | POA: Diagnosis not present

## 2022-07-08 DIAGNOSIS — R5381 Other malaise: Secondary | ICD-10-CM | POA: Diagnosis not present

## 2022-07-08 DIAGNOSIS — I2510 Atherosclerotic heart disease of native coronary artery without angina pectoris: Secondary | ICD-10-CM | POA: Diagnosis not present

## 2022-07-08 DIAGNOSIS — N390 Urinary tract infection, site not specified: Secondary | ICD-10-CM | POA: Diagnosis not present

## 2022-07-08 DIAGNOSIS — B9620 Unspecified Escherichia coli [E. coli] as the cause of diseases classified elsewhere: Secondary | ICD-10-CM | POA: Diagnosis not present

## 2022-07-08 DIAGNOSIS — D62 Acute posthemorrhagic anemia: Secondary | ICD-10-CM | POA: Diagnosis not present

## 2022-07-08 DIAGNOSIS — E222 Syndrome of inappropriate secretion of antidiuretic hormone: Secondary | ICD-10-CM | POA: Diagnosis not present

## 2022-07-08 DIAGNOSIS — C170 Malignant neoplasm of duodenum: Secondary | ICD-10-CM | POA: Diagnosis not present

## 2022-07-14 DIAGNOSIS — C170 Malignant neoplasm of duodenum: Secondary | ICD-10-CM | POA: Diagnosis not present

## 2022-07-14 DIAGNOSIS — K922 Gastrointestinal hemorrhage, unspecified: Secondary | ICD-10-CM | POA: Diagnosis not present

## 2022-07-14 DIAGNOSIS — D62 Acute posthemorrhagic anemia: Secondary | ICD-10-CM | POA: Diagnosis not present

## 2022-07-14 DIAGNOSIS — K219 Gastro-esophageal reflux disease without esophagitis: Secondary | ICD-10-CM | POA: Diagnosis not present

## 2022-07-14 DIAGNOSIS — E222 Syndrome of inappropriate secretion of antidiuretic hormone: Secondary | ICD-10-CM | POA: Diagnosis not present

## 2022-07-14 DIAGNOSIS — R5381 Other malaise: Secondary | ICD-10-CM | POA: Diagnosis not present

## 2022-07-14 DIAGNOSIS — I10 Essential (primary) hypertension: Secondary | ICD-10-CM | POA: Diagnosis not present

## 2022-07-14 DIAGNOSIS — I2510 Atherosclerotic heart disease of native coronary artery without angina pectoris: Secondary | ICD-10-CM | POA: Diagnosis not present

## 2022-07-14 DIAGNOSIS — E785 Hyperlipidemia, unspecified: Secondary | ICD-10-CM | POA: Diagnosis not present

## 2022-07-17 DIAGNOSIS — C170 Malignant neoplasm of duodenum: Secondary | ICD-10-CM | POA: Diagnosis not present

## 2022-07-17 DIAGNOSIS — F0393 Unspecified dementia, unspecified severity, with mood disturbance: Secondary | ICD-10-CM | POA: Diagnosis not present

## 2022-07-17 DIAGNOSIS — Z66 Do not resuscitate: Secondary | ICD-10-CM | POA: Diagnosis not present

## 2022-07-22 DIAGNOSIS — N390 Urinary tract infection, site not specified: Secondary | ICD-10-CM | POA: Diagnosis not present

## 2022-07-22 DIAGNOSIS — D62 Acute posthemorrhagic anemia: Secondary | ICD-10-CM | POA: Diagnosis not present

## 2022-07-22 DIAGNOSIS — C170 Malignant neoplasm of duodenum: Secondary | ICD-10-CM | POA: Diagnosis not present

## 2022-07-22 DIAGNOSIS — K922 Gastrointestinal hemorrhage, unspecified: Secondary | ICD-10-CM | POA: Diagnosis not present

## 2022-07-22 DIAGNOSIS — I10 Essential (primary) hypertension: Secondary | ICD-10-CM | POA: Diagnosis not present

## 2022-07-22 DIAGNOSIS — B9620 Unspecified Escherichia coli [E. coli] as the cause of diseases classified elsewhere: Secondary | ICD-10-CM | POA: Diagnosis not present

## 2022-07-22 DIAGNOSIS — E222 Syndrome of inappropriate secretion of antidiuretic hormone: Secondary | ICD-10-CM | POA: Diagnosis not present

## 2022-07-22 DIAGNOSIS — R5381 Other malaise: Secondary | ICD-10-CM | POA: Diagnosis not present

## 2022-07-22 DIAGNOSIS — I2510 Atherosclerotic heart disease of native coronary artery without angina pectoris: Secondary | ICD-10-CM | POA: Diagnosis not present

## 2022-07-28 ENCOUNTER — Ambulatory Visit: Payer: Medicare HMO | Admitting: Neurology

## 2022-07-28 ENCOUNTER — Encounter: Payer: Self-pay | Admitting: Neurology

## 2022-08-07 DIAGNOSIS — M6281 Muscle weakness (generalized): Secondary | ICD-10-CM | POA: Diagnosis not present

## 2022-08-18 DIAGNOSIS — M6281 Muscle weakness (generalized): Secondary | ICD-10-CM | POA: Diagnosis not present

## 2022-08-20 DIAGNOSIS — I10 Essential (primary) hypertension: Secondary | ICD-10-CM | POA: Diagnosis not present

## 2022-08-20 DIAGNOSIS — D649 Anemia, unspecified: Secondary | ICD-10-CM | POA: Diagnosis not present

## 2022-08-20 DIAGNOSIS — F329 Major depressive disorder, single episode, unspecified: Secondary | ICD-10-CM | POA: Diagnosis not present

## 2022-08-20 DIAGNOSIS — C170 Malignant neoplasm of duodenum: Secondary | ICD-10-CM | POA: Diagnosis not present

## 2022-08-20 DIAGNOSIS — E785 Hyperlipidemia, unspecified: Secondary | ICD-10-CM | POA: Diagnosis not present

## 2022-08-20 DIAGNOSIS — E222 Syndrome of inappropriate secretion of antidiuretic hormone: Secondary | ICD-10-CM | POA: Diagnosis not present

## 2022-08-21 DIAGNOSIS — M6281 Muscle weakness (generalized): Secondary | ICD-10-CM | POA: Diagnosis not present

## 2022-08-25 DIAGNOSIS — M6281 Muscle weakness (generalized): Secondary | ICD-10-CM | POA: Diagnosis not present

## 2022-08-27 DIAGNOSIS — M6281 Muscle weakness (generalized): Secondary | ICD-10-CM | POA: Diagnosis not present

## 2022-08-30 DIAGNOSIS — M6281 Muscle weakness (generalized): Secondary | ICD-10-CM | POA: Diagnosis not present

## 2022-08-31 DIAGNOSIS — M6281 Muscle weakness (generalized): Secondary | ICD-10-CM | POA: Diagnosis not present

## 2022-09-07 DIAGNOSIS — M6281 Muscle weakness (generalized): Secondary | ICD-10-CM | POA: Diagnosis not present

## 2022-09-15 DIAGNOSIS — I10 Essential (primary) hypertension: Secondary | ICD-10-CM | POA: Diagnosis not present

## 2022-09-15 DIAGNOSIS — M6281 Muscle weakness (generalized): Secondary | ICD-10-CM | POA: Diagnosis not present

## 2022-09-15 DIAGNOSIS — D649 Anemia, unspecified: Secondary | ICD-10-CM | POA: Diagnosis not present

## 2022-09-16 DIAGNOSIS — M6281 Muscle weakness (generalized): Secondary | ICD-10-CM | POA: Diagnosis not present

## 2022-09-17 DIAGNOSIS — E222 Syndrome of inappropriate secretion of antidiuretic hormone: Secondary | ICD-10-CM | POA: Diagnosis not present

## 2022-09-17 DIAGNOSIS — R296 Repeated falls: Secondary | ICD-10-CM | POA: Diagnosis not present

## 2022-09-17 DIAGNOSIS — R4189 Other symptoms and signs involving cognitive functions and awareness: Secondary | ICD-10-CM | POA: Diagnosis not present

## 2022-09-17 DIAGNOSIS — C170 Malignant neoplasm of duodenum: Secondary | ICD-10-CM | POA: Diagnosis not present

## 2022-09-18 DIAGNOSIS — M25551 Pain in right hip: Secondary | ICD-10-CM | POA: Diagnosis not present

## 2022-09-18 DIAGNOSIS — M545 Low back pain, unspecified: Secondary | ICD-10-CM | POA: Diagnosis not present

## 2022-09-18 DIAGNOSIS — M25552 Pain in left hip: Secondary | ICD-10-CM | POA: Diagnosis not present

## 2022-09-18 DIAGNOSIS — R102 Pelvic and perineal pain: Secondary | ICD-10-CM | POA: Diagnosis not present

## 2022-09-20 DIAGNOSIS — M6281 Muscle weakness (generalized): Secondary | ICD-10-CM | POA: Diagnosis not present

## 2022-09-23 DIAGNOSIS — M6281 Muscle weakness (generalized): Secondary | ICD-10-CM | POA: Diagnosis not present

## 2022-09-28 DIAGNOSIS — M6281 Muscle weakness (generalized): Secondary | ICD-10-CM | POA: Diagnosis not present

## 2022-09-30 DIAGNOSIS — M6281 Muscle weakness (generalized): Secondary | ICD-10-CM | POA: Diagnosis not present

## 2022-10-06 DIAGNOSIS — M6281 Muscle weakness (generalized): Secondary | ICD-10-CM | POA: Diagnosis not present

## 2022-10-07 DIAGNOSIS — M6281 Muscle weakness (generalized): Secondary | ICD-10-CM | POA: Diagnosis not present

## 2022-10-11 DIAGNOSIS — M6281 Muscle weakness (generalized): Secondary | ICD-10-CM | POA: Diagnosis not present

## 2022-10-15 DIAGNOSIS — M6281 Muscle weakness (generalized): Secondary | ICD-10-CM | POA: Diagnosis not present

## 2022-10-15 DIAGNOSIS — F0390 Unspecified dementia without behavioral disturbance: Secondary | ICD-10-CM | POA: Diagnosis not present

## 2022-10-19 DIAGNOSIS — M6281 Muscle weakness (generalized): Secondary | ICD-10-CM | POA: Diagnosis not present

## 2022-10-20 DIAGNOSIS — M6281 Muscle weakness (generalized): Secondary | ICD-10-CM | POA: Diagnosis not present

## 2022-10-27 DIAGNOSIS — I469 Cardiac arrest, cause unspecified: Secondary | ICD-10-CM | POA: Diagnosis not present

## 2022-10-27 DIAGNOSIS — R0902 Hypoxemia: Secondary | ICD-10-CM | POA: Diagnosis not present

## 2022-10-27 DIAGNOSIS — R55 Syncope and collapse: Secondary | ICD-10-CM | POA: Diagnosis not present

## 2022-10-27 DIAGNOSIS — R4182 Altered mental status, unspecified: Secondary | ICD-10-CM | POA: Diagnosis not present

## 2022-11-16 DEATH — deceased
